# Patient Record
Sex: Male | Born: 1940 | Race: White | Hispanic: No | Marital: Married | Smoking: Never smoker
Health system: Southern US, Community
[De-identification: ages and names within clinical notes are randomized; demographics above are authoritative.]

## PROBLEM LIST (undated history)

## (undated) DIAGNOSIS — I2510 Atherosclerotic heart disease of native coronary artery without angina pectoris: Secondary | ICD-10-CM

## (undated) DIAGNOSIS — J45909 Unspecified asthma, uncomplicated: Secondary | ICD-10-CM

## (undated) DIAGNOSIS — I10 Essential (primary) hypertension: Secondary | ICD-10-CM

## (undated) DIAGNOSIS — N189 Chronic kidney disease, unspecified: Secondary | ICD-10-CM

## (undated) DIAGNOSIS — K219 Gastro-esophageal reflux disease without esophagitis: Secondary | ICD-10-CM

## (undated) HISTORY — PX: TRANSURETHRAL RESECTION OF PROSTATE: SHX73

## (undated) HISTORY — PX: CHOLECYSTECTOMY: SHX55

## (undated) HISTORY — PX: EYE SURGERY: SHX253

---

## 2005-04-06 ENCOUNTER — Ambulatory Visit: Payer: Self-pay | Admitting: Urology

## 2006-07-13 ENCOUNTER — Ambulatory Visit: Payer: Self-pay | Admitting: Gastroenterology

## 2006-09-05 ENCOUNTER — Ambulatory Visit: Payer: Self-pay | Admitting: Urology

## 2006-09-10 ENCOUNTER — Ambulatory Visit: Payer: Self-pay | Admitting: Urology

## 2006-11-14 ENCOUNTER — Ambulatory Visit: Payer: Self-pay | Admitting: Specialist

## 2007-09-04 ENCOUNTER — Ambulatory Visit: Payer: Self-pay | Admitting: Urology

## 2007-09-09 ENCOUNTER — Ambulatory Visit: Payer: Self-pay | Admitting: Urology

## 2008-09-04 ENCOUNTER — Ambulatory Visit: Payer: Self-pay | Admitting: Gastroenterology

## 2008-09-07 ENCOUNTER — Ambulatory Visit: Payer: Self-pay | Admitting: Urology

## 2008-09-22 ENCOUNTER — Ambulatory Visit: Payer: Self-pay | Admitting: Gastroenterology

## 2009-09-15 ENCOUNTER — Ambulatory Visit: Payer: Self-pay | Admitting: Urology

## 2009-10-14 ENCOUNTER — Ambulatory Visit: Payer: Self-pay | Admitting: Gastroenterology

## 2009-10-14 ENCOUNTER — Other Ambulatory Visit: Payer: Self-pay | Admitting: Radiology

## 2010-05-09 ENCOUNTER — Ambulatory Visit: Payer: Self-pay | Admitting: Gastroenterology

## 2010-09-21 ENCOUNTER — Ambulatory Visit: Payer: Self-pay | Admitting: Gastroenterology

## 2010-09-22 LAB — PATHOLOGY REPORT

## 2010-09-30 ENCOUNTER — Ambulatory Visit: Payer: Self-pay | Admitting: Urology

## 2010-11-24 ENCOUNTER — Ambulatory Visit: Payer: Self-pay | Admitting: Internal Medicine

## 2011-10-04 ENCOUNTER — Ambulatory Visit: Payer: Self-pay | Admitting: Urology

## 2012-10-02 ENCOUNTER — Ambulatory Visit: Payer: Self-pay | Admitting: Urology

## 2012-10-02 DIAGNOSIS — N411 Chronic prostatitis: Secondary | ICD-10-CM | POA: Insufficient documentation

## 2012-10-02 DIAGNOSIS — N4340 Spermatocele of epididymis, unspecified: Secondary | ICD-10-CM | POA: Insufficient documentation

## 2012-10-02 DIAGNOSIS — N138 Other obstructive and reflux uropathy: Secondary | ICD-10-CM | POA: Insufficient documentation

## 2012-10-02 DIAGNOSIS — N200 Calculus of kidney: Secondary | ICD-10-CM | POA: Insufficient documentation

## 2013-09-24 ENCOUNTER — Emergency Department: Payer: Self-pay | Admitting: Emergency Medicine

## 2013-09-24 LAB — URINALYSIS, COMPLETE
Bilirubin,UR: NEGATIVE
Glucose,UR: NEGATIVE mg/dL (ref 0–75)
KETONE: NEGATIVE
Leukocyte Esterase: NEGATIVE
Nitrite: NEGATIVE
Ph: 6 (ref 4.5–8.0)
Protein: NEGATIVE
RBC,UR: 180 /HPF (ref 0–5)
Specific Gravity: 1.011 (ref 1.003–1.030)
Squamous Epithelial: NONE SEEN
WBC UR: 1 /HPF (ref 0–5)

## 2013-09-24 LAB — COMPREHENSIVE METABOLIC PANEL
ALK PHOS: 73 U/L
AST: 30 U/L (ref 15–37)
Albumin: 4.1 g/dL (ref 3.4–5.0)
Anion Gap: 6 — ABNORMAL LOW (ref 7–16)
BUN: 17 mg/dL (ref 7–18)
Bilirubin,Total: 0.4 mg/dL (ref 0.2–1.0)
CHLORIDE: 109 mmol/L — AB (ref 98–107)
Calcium, Total: 9.1 mg/dL (ref 8.5–10.1)
Co2: 25 mmol/L (ref 21–32)
Creatinine: 1.3 mg/dL (ref 0.60–1.30)
EGFR (African American): >60
EGFR (Non-African Amer.): 54 — ABNORMAL LOW
Glucose: 112 mg/dL — ABNORMAL HIGH (ref 65–99)
Osmolality: 282 (ref 275–301)
Potassium: 3.7 mmol/L (ref 3.5–5.1)
SGPT (ALT): 24 U/L (ref 12–78)
Sodium: 140 mmol/L (ref 136–145)
TOTAL PROTEIN: 7.7 g/dL (ref 6.4–8.2)

## 2013-09-24 LAB — CBC
HCT: 42.5 % (ref 40.0–52.0)
HGB: 13.2 g/dL (ref 13.0–18.0)
MCH: 26.8 pg (ref 26.0–34.0)
MCHC: 31.1 g/dL — ABNORMAL LOW (ref 32.0–36.0)
MCV: 86 fL (ref 80–100)
Platelet: 221 10*3/uL (ref 150–440)
RBC: 4.93 10*6/uL (ref 4.40–5.90)
RDW: 13.6 % (ref 11.5–14.5)
WBC: 11.8 10*3/uL — AB (ref 3.8–10.6)

## 2013-10-09 ENCOUNTER — Ambulatory Visit: Payer: Self-pay | Admitting: Urology

## 2013-10-16 ENCOUNTER — Ambulatory Visit: Payer: Self-pay | Admitting: Urology

## 2013-10-22 ENCOUNTER — Ambulatory Visit: Payer: Self-pay | Admitting: Urology

## 2013-10-22 DIAGNOSIS — N201 Calculus of ureter: Secondary | ICD-10-CM | POA: Insufficient documentation

## 2013-10-22 DIAGNOSIS — N23 Unspecified renal colic: Secondary | ICD-10-CM | POA: Insufficient documentation

## 2013-10-23 ENCOUNTER — Ambulatory Visit: Payer: Self-pay | Admitting: Urology

## 2013-11-07 ENCOUNTER — Ambulatory Visit: Payer: Self-pay | Admitting: Urology

## 2013-11-07 DIAGNOSIS — J449 Chronic obstructive pulmonary disease, unspecified: Secondary | ICD-10-CM | POA: Insufficient documentation

## 2014-02-04 DIAGNOSIS — I10 Essential (primary) hypertension: Secondary | ICD-10-CM | POA: Insufficient documentation

## 2014-09-01 DIAGNOSIS — M1A9XX0 Chronic gout, unspecified, without tophus (tophi): Secondary | ICD-10-CM | POA: Insufficient documentation

## 2014-09-01 DIAGNOSIS — D1803 Hemangioma of intra-abdominal structures: Secondary | ICD-10-CM | POA: Insufficient documentation

## 2014-09-01 DIAGNOSIS — M1A00X0 Idiopathic chronic gout, unspecified site, without tophus (tophi): Secondary | ICD-10-CM | POA: Insufficient documentation

## 2014-09-12 NOTE — Op Note (Signed)
PATIENT NAME:  Tyler Walls, Tyler Walls MR#:  [id number] DATE OF BIRTH:  04-03-1941  DATE OF PROCEDURE:  10/23/2013  PRINCIPAL DIAGNOSIS: Left ureterolithiasis.   POSTOPERATIVE DIAGNOSIS:  Left ureterolithiasis.   PROCEDURE: Left ureteroscopy with holmium laser lithotripsy.   SURGEON: Denice Bors. Jacqlyn Larsen, M.D.   ANESTHESIA: Laryngeal mask airway anesthesia.   INDICATIONS: The patient is a 74 year old white gentleman with a history of nephrolithiasis. He presented to the Emergency Room recently with significant left-sided flank pain. He was noted to have an approximate 5 mm proximal left ureteral calculus. Subsequent follow-up imaging demonstrated a subsequent 6 mm proximal left ureteral calculus in close proximity. This had also followed into the proximal ureter. He had progression of the stone to the mid ureter overlying the pelvic brim. He underwent lithotripsy approximately one week ago.  He only had partial fragmentation of the stone. He has had continued pain and discomfort with failure of progression of the stone fragments. He presents for ureteroscopic stone removal.   DESCRIPTION OF PROCEDURE: After informed consent was obtained, the patient was taken to the Operating Room and placed in the dorsal lithotomy position under laryngeal mask airway anesthesia. The patient was then prepped and draped in the usual standard fashion. The 64 French rigid cystoscope was introduced into the urethra, under direct vision, with no urethral abnormalities noted. Upon entering the entering the prostatic fossa, moderate TUR defect was noted with minimal prostate regrowth appreciated. The bladder neck was open. Upon entering the bladder, the mucosa was inspected in its entirety with no gross mucosal lesions noted. Bilateral ureteral orifices were well visualized, with no lesions noted. A flexible tip Glidewire was introduced into the left ureteral orifice. It was easily advanced into the upper pole collecting system under  fluoroscopic guidance without difficulty. No resistance was met at the level of the stones. The cystoscope was removed. The 6 French rigid ureteroscope was advanced into the urinary bladder. It was easily advanced into the left ureteral orifice. It was advanced to the level approaching the pelvic brim. A lead stone fragment was encountered, approximately 3 to 4 mm in size. This was fragmented into additional smaller stone fragments, utilizing the holmium laser fiber. The stone was noted to be very hard, requiring additional energy for fragmentation. At least two additional stone fragments were encountered. The larger 6 mm stone was then encountered. Minimal fragmentation of this stone was appreciated. Once again, the stone was noted to be very hard, and required additional energy for adequate fragmentation. Moderate inflammation and edema of the ureter was noted at the level of the stone impaction.  Once the stone was adequately fragmented, a basket was utilized to remove the stone fragments. Some additional stone fragments were noted just proximal to the area of stone fragmentation and impaction. These were likely from the previous lithotripsy.  One of these required additional fragmentation for stone removal. Once the bulk of the stones had been removed, the scope was advanced to just above the level of the pelvic brim. Due to angulation and the lack of paralyzation, the scope could not be passed further. No additional stones were appreciated under fluoroscopy. The decision was made not to attempt any further ureteroscopy due to the degree of edema within the ureter. The ureter appeared, however, adequately open. The decision was made not to place a stent. The ureteroscope was removed. The cystoscope was replaced into the urinary bladder. The stone fragments were irrigated free. These were collected and will be sent for stone analysis. The  bladder was then drained. The cystoscope was removed. The patient was  returned to the supine position and awakened from laryngeal mask airway anesthesia. He was taken to the recovery room in stable condition. There were no problems or complications. The patient tolerated the procedure well.   SPECIMEN: Stone fragments.   ____________________________ Denice Bors. Jacqlyn Larsen, MD bsc:aj D: 10/23/2013 23:21:57 ET T: 10/24/2013 06:23:04 ET JOB#: [id number]  cc: Denice Bors. Jacqlyn Larsen, MD, <Dictator> Denice Bors Jawaan Adachi MD ELECTRONICALLY SIGNED 11/11/2013 9:25

## 2015-04-20 ENCOUNTER — Ambulatory Visit: Admit: 2015-04-20 | Payer: Self-pay | Admitting: Gastroenterology

## 2015-04-20 SURGERY — COLONOSCOPY WITH PROPOFOL
Anesthesia: General

## 2015-05-31 ENCOUNTER — Encounter: Payer: Self-pay | Admitting: *Deleted

## 2015-06-01 ENCOUNTER — Ambulatory Visit: Admission: RE | Admit: 2015-06-01 | Payer: Medicare Other | Source: Ambulatory Visit | Admitting: Gastroenterology

## 2015-06-01 ENCOUNTER — Encounter: Admission: RE | Payer: Self-pay | Source: Ambulatory Visit

## 2015-06-01 HISTORY — DX: Chronic kidney disease, unspecified: N18.9

## 2015-06-01 HISTORY — DX: Gastro-esophageal reflux disease without esophagitis: K21.9

## 2015-06-01 HISTORY — DX: Unspecified asthma, uncomplicated: J45.909

## 2015-06-01 HISTORY — DX: Essential (primary) hypertension: I10

## 2015-06-01 HISTORY — DX: Atherosclerotic heart disease of native coronary artery without angina pectoris: I25.10

## 2015-06-01 SURGERY — COLONOSCOPY WITH PROPOFOL
Anesthesia: General

## 2015-09-02 DIAGNOSIS — E782 Mixed hyperlipidemia: Secondary | ICD-10-CM | POA: Insufficient documentation

## 2015-12-14 IMAGING — CT CT STONE STUDY
2 of 4 series · 16 of 46 positions shown, 18 images · non-contrast
Comparison: DG ABDOMEN 1V dated 10/02/2012

ADDENDUM:
Not mentioned above is a 2.8 cm hypodense, fluid attenuating right
hepatic mass most consistent with a cyst. There is a second 2 cm
hypodense, fluid attenuating right hepatic mass likely representing
a cyst. There are other smaller subcentimeter hypodensities within
the liver which are too small to characterize, but likely represent
small cysts. These are unchanged compared with prior abdominal MRI
dated 05/09/2010.
CLINICAL DATA: Left flank pain radiating around the left abdomen
since [REDACTED].

EXAM:
CT ABDOMEN AND PELVIS WITHOUT CONTRAST
TECHNIQUE: Multidetector CT imaging of the abdomen and pelvis was performed
following the standard protocol without IV contrast.

[Series 2: stone standard full · axial · 0.86mm/px · z∈[+292,+757]mm · 13 of 103 slices shown, 15 images]
[im 5/103  soft-tissue]
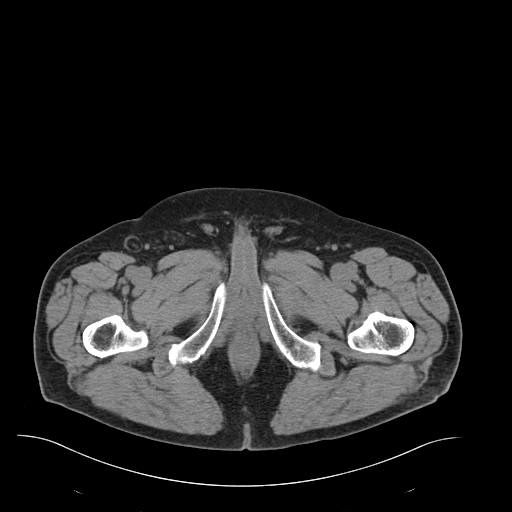
[im 5/103  bone]
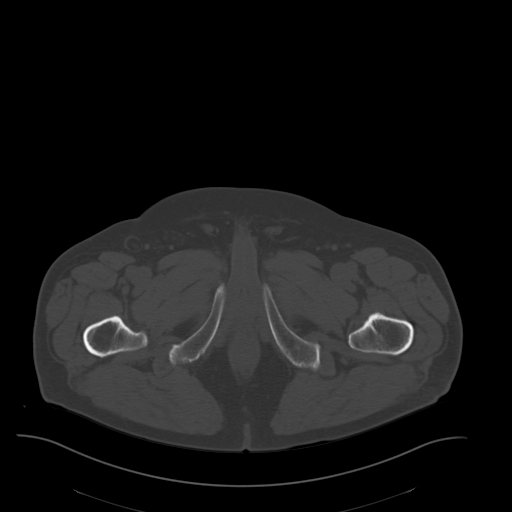
[im 13/103  soft-tissue]
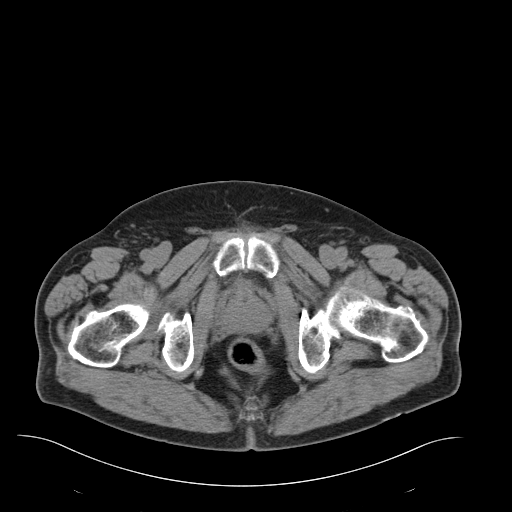
[im 22/103  soft-tissue]
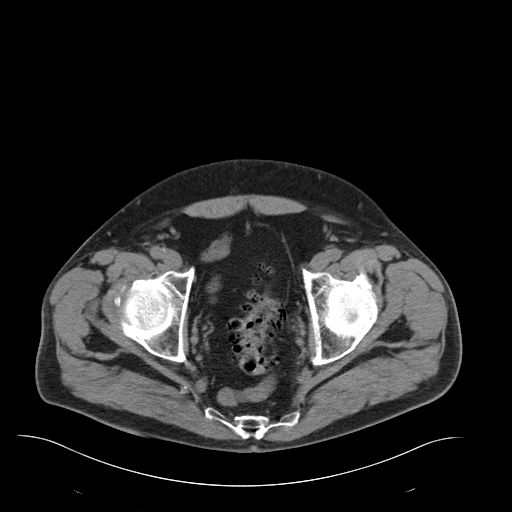
[im 30/103  soft-tissue]
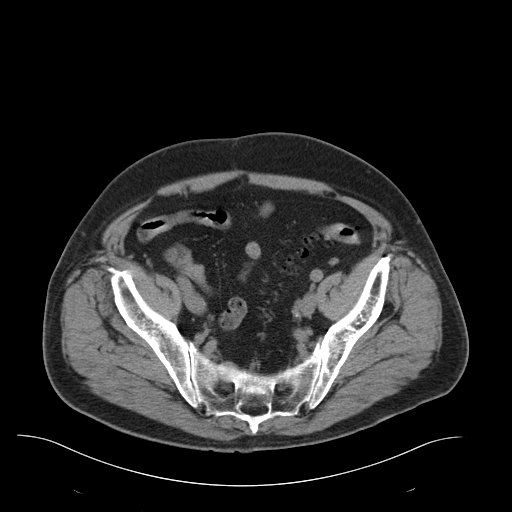
[im 35/103  soft-tissue]
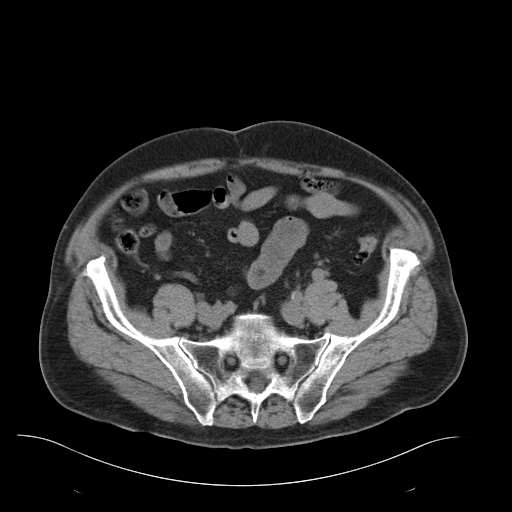
[im 43/103  soft-tissue]
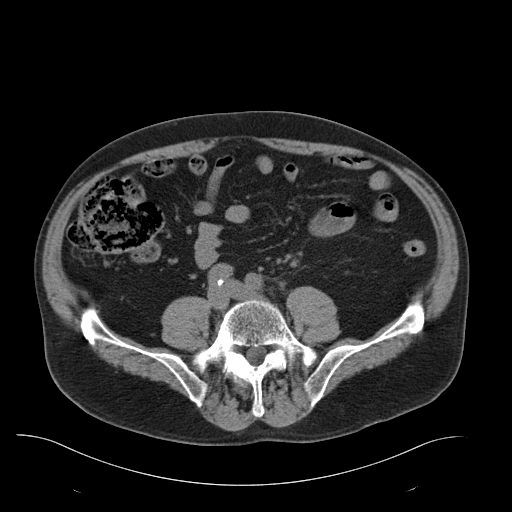
[im 52/103  soft-tissue]
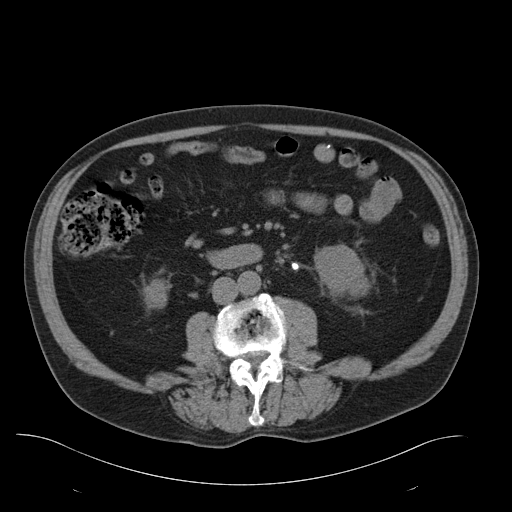
[im 60/103  soft-tissue]
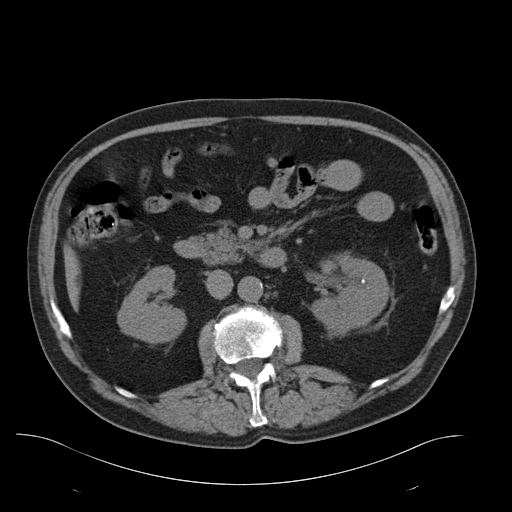
[im 69/103  soft-tissue]
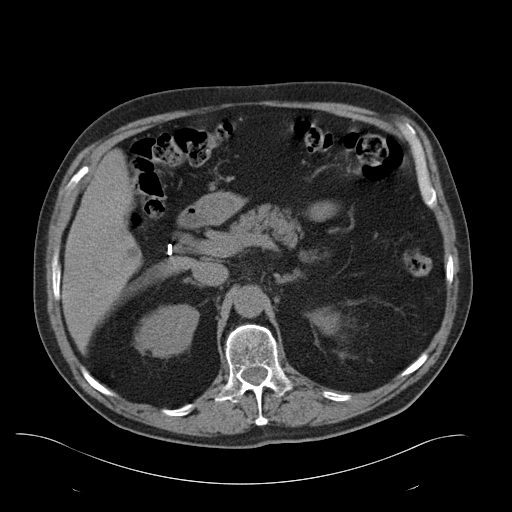
[im 69/103  bone]
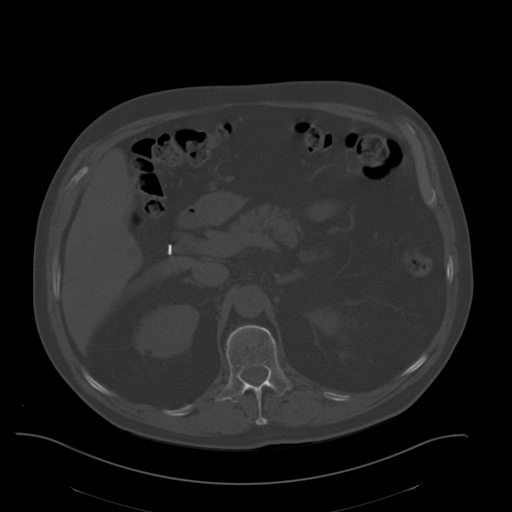
[im 73/103  soft-tissue]
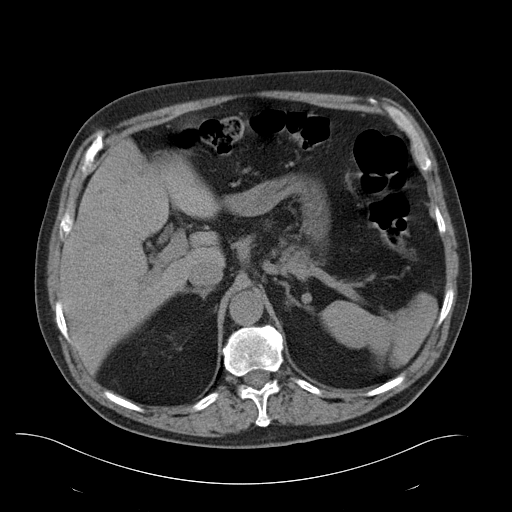
[im 81/103  soft-tissue]
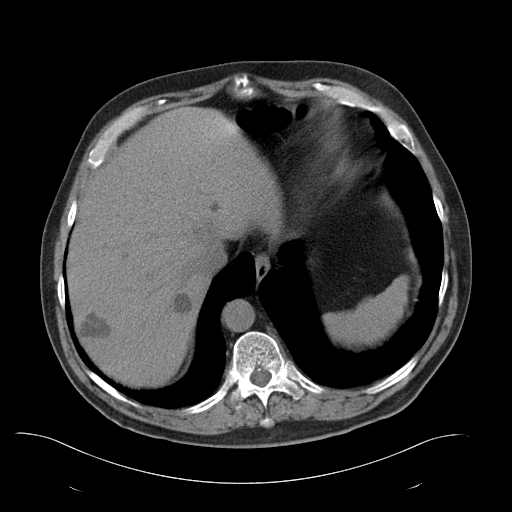
[im 90/103  soft-tissue]
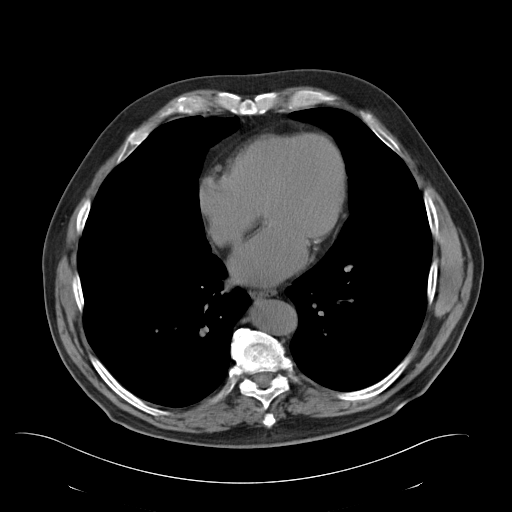
[im 98/103  soft-tissue]
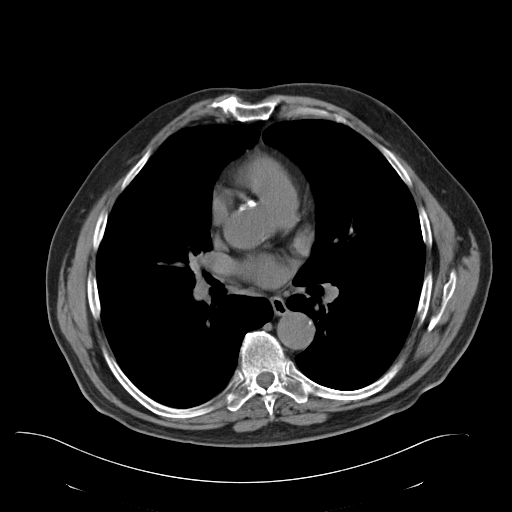

[Series 5: cor stone standard full · coronal · 0.88mm/px · 3 of 159 slices shown]
[im 53/159  soft-tissue]
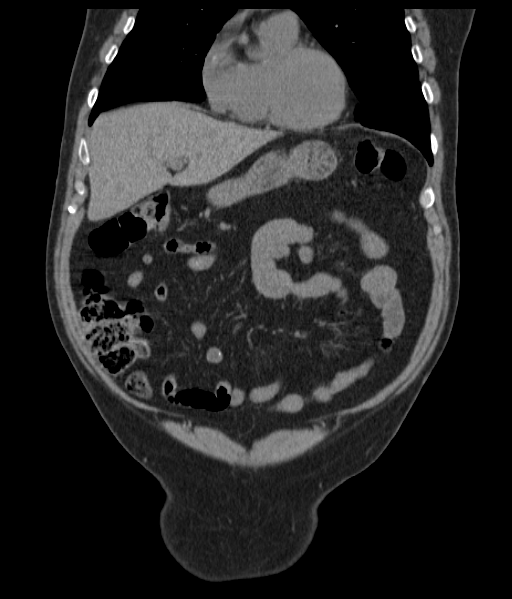
[im 71/159  soft-tissue]
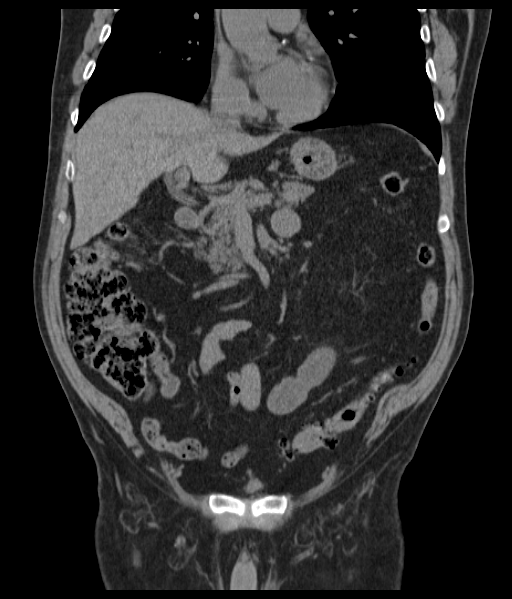
[im 88/159  soft-tissue]
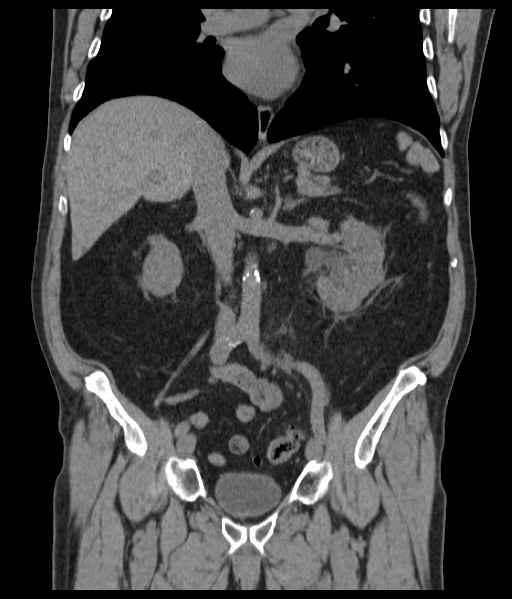

[16 of 46 positions shown; findings below may reference images not displayed]

FINDINGS: The lung bases are clear. There is coronary artery atherosclerosis
in the left main, LAD and right coronary artery.

There is a 6.2 mm proximal left ureteral calculus resulting in mild
left hydronephrosis and perinephric stranding. There are multiple
bilateral nephrolithiasis. The kidneys are symmetric in size without
evidence for exophytic mass. The bladder is unremarkable.

The liver demonstrates no focal abnormality. The gallbladder is
unremarkable. The spleen demonstrates no focal abnormality. The
adrenal glands and pancreas are normal.

The unopacified stomach, duodenum, small intestine and large
intestine are unremarkable, but evaluation is limited by lack of
oral contrast. There is a normal caliber appendix in the right lower
quadrant without periappendiceal inflammatory changes. There is
diverticulosis without evidence of diverticulitis. There is no
pneumoperitoneum, pneumatosis, or portal venous gas. There is no
abdominal or pelvic free fluid. There is no lymphadenopathy.

The abdominal aorta is normal in caliber with atherosclerosis.

There is degenerative disc disease at L1-2, L2-3, L3-4 and L4-5.
There is severe bilateral facet arthropathy at L3-4, L4-5 and L5-S1.
IMPRESSION: 1. There is a 6.2 mm proximal left ureteral calculus resulting in
mild left hydronephrosis and perinephric stranding.
2. Bilateral nephrolithiasis.
3. Lumbar spine spondylosis.

## 2016-09-05 DIAGNOSIS — E538 Deficiency of other specified B group vitamins: Secondary | ICD-10-CM | POA: Insufficient documentation

## 2017-09-06 DIAGNOSIS — G4733 Obstructive sleep apnea (adult) (pediatric): Secondary | ICD-10-CM | POA: Insufficient documentation

## 2017-09-06 DIAGNOSIS — Z9989 Dependence on other enabling machines and devices: Secondary | ICD-10-CM

## 2017-09-06 DIAGNOSIS — Z0000 Encounter for general adult medical examination without abnormal findings: Secondary | ICD-10-CM | POA: Insufficient documentation

## 2017-10-04 DIAGNOSIS — I4729 Other ventricular tachycardia: Secondary | ICD-10-CM | POA: Insufficient documentation

## 2017-10-04 DIAGNOSIS — I472 Ventricular tachycardia: Secondary | ICD-10-CM | POA: Insufficient documentation

## 2018-04-20 ENCOUNTER — Emergency Department: Payer: No Typology Code available for payment source

## 2018-04-20 ENCOUNTER — Encounter: Payer: Self-pay | Admitting: Emergency Medicine

## 2018-04-20 ENCOUNTER — Other Ambulatory Visit: Payer: Self-pay

## 2018-04-20 ENCOUNTER — Emergency Department
Admission: EM | Admit: 2018-04-20 | Discharge: 2018-04-20 | Disposition: A | Discharge status: Home / Self Care | Payer: No Typology Code available for payment source | Attending: Emergency Medicine | Admitting: Emergency Medicine

## 2018-04-20 DIAGNOSIS — I2510 Atherosclerotic heart disease of native coronary artery without angina pectoris: Secondary | ICD-10-CM | POA: Insufficient documentation

## 2018-04-20 DIAGNOSIS — Y9389 Activity, other specified: Secondary | ICD-10-CM | POA: Insufficient documentation

## 2018-04-20 DIAGNOSIS — J45909 Unspecified asthma, uncomplicated: Secondary | ICD-10-CM | POA: Insufficient documentation

## 2018-04-20 DIAGNOSIS — S2231XA Fracture of one rib, right side, initial encounter for closed fracture: Secondary | ICD-10-CM | POA: Diagnosis not present

## 2018-04-20 DIAGNOSIS — N189 Chronic kidney disease, unspecified: Secondary | ICD-10-CM | POA: Insufficient documentation

## 2018-04-20 DIAGNOSIS — K869 Disease of pancreas, unspecified: Secondary | ICD-10-CM

## 2018-04-20 DIAGNOSIS — I129 Hypertensive chronic kidney disease with stage 1 through stage 4 chronic kidney disease, or unspecified chronic kidney disease: Secondary | ICD-10-CM | POA: Insufficient documentation

## 2018-04-20 DIAGNOSIS — Z79899 Other long term (current) drug therapy: Secondary | ICD-10-CM | POA: Insufficient documentation

## 2018-04-20 DIAGNOSIS — S299XXA Unspecified injury of thorax, initial encounter: Secondary | ICD-10-CM | POA: Diagnosis present

## 2018-04-20 DIAGNOSIS — Y999 Unspecified external cause status: Secondary | ICD-10-CM | POA: Insufficient documentation

## 2018-04-20 DIAGNOSIS — D490 Neoplasm of unspecified behavior of digestive system: Secondary | ICD-10-CM | POA: Diagnosis not present

## 2018-04-20 DIAGNOSIS — Y92410 Unspecified street and highway as the place of occurrence of the external cause: Secondary | ICD-10-CM | POA: Diagnosis not present

## 2018-04-20 LAB — COMPREHENSIVE METABOLIC PANEL
ALT: 21 U/L (ref 0–44)
AST: 27 U/L (ref 15–41)
Albumin: 4.6 g/dL (ref 3.5–5.0)
Alkaline Phosphatase: 66 U/L (ref 38–126)
Anion gap: 10 (ref 5–15)
BUN: 15 mg/dL (ref 8–23)
CO2: 28 mmol/L (ref 22–32)
CREATININE: 0.87 mg/dL (ref 0.61–1.24)
Calcium: 9.4 mg/dL (ref 8.9–10.3)
Chloride: 103 mmol/L (ref 98–111)
Glucose, Bld: 121 mg/dL — ABNORMAL HIGH (ref 70–99)
POTASSIUM: 3.6 mmol/L (ref 3.5–5.1)
Sodium: 141 mmol/L (ref 135–145)
Total Bilirubin: 0.8 mg/dL (ref 0.3–1.2)
Total Protein: 7.7 g/dL (ref 6.5–8.1)

## 2018-04-20 LAB — CBC WITH DIFFERENTIAL/PLATELET
Abs Immature Granulocytes: 0.04 10*3/uL (ref 0.00–0.07)
Basophils Absolute: 0 10*3/uL (ref 0.0–0.1)
Basophils Relative: 0 %
EOS ABS: 0.4 10*3/uL (ref 0.0–0.5)
EOS PCT: 4 %
HEMATOCRIT: 43.9 % (ref 39.0–52.0)
Hemoglobin: 13.8 g/dL (ref 13.0–17.0)
Immature Granulocytes: 0 %
Lymphocytes Relative: 14 %
Lymphs Abs: 1.3 10*3/uL (ref 0.7–4.0)
MCH: 28 pg (ref 26.0–34.0)
MCHC: 31.4 g/dL (ref 30.0–36.0)
MCV: 89.2 fL (ref 80.0–100.0)
MONOS PCT: 7 %
Monocytes Absolute: 0.6 10*3/uL (ref 0.1–1.0)
NRBC: 0 % (ref 0.0–0.2)
Neutro Abs: 6.7 10*3/uL (ref 1.7–7.7)
Neutrophils Relative %: 75 %
Platelets: 257 10*3/uL (ref 150–400)
RBC: 4.92 MIL/uL (ref 4.22–5.81)
RDW: 13.7 % (ref 11.5–15.5)
WBC: 9 10*3/uL (ref 4.0–10.5)

## 2018-04-20 MED ORDER — HYDROCODONE-ACETAMINOPHEN 5-325 MG PO TABS: 1.0000 | ORAL_TABLET | ORAL | 0 refills | Status: DC | PRN

## 2018-04-20 MED ORDER — IOHEXOL 350 MG/ML SOLN
75.0000 mL | Freq: Once | INTRAVENOUS | Status: AC | PRN
  Administered 2018-04-20: 75 mL via INTRAVENOUS
  Filled 2018-04-20: qty 75

## 2018-04-20 MED ORDER — SODIUM CHLORIDE 0.9 % IV BOLUS
1000.0000 mL | Freq: Once | INTRAVENOUS | Status: AC
  Administered 2018-04-20: 1000 mL via INTRAVENOUS
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
## 2018-05-01 ENCOUNTER — Encounter: Payer: Self-pay | Admitting: Urology

## 2018-05-01 ENCOUNTER — Other Ambulatory Visit: Payer: Self-pay

## 2018-05-01 ENCOUNTER — Ambulatory Visit (INDEPENDENT_AMBULATORY_CARE_PROVIDER_SITE_OTHER): Payer: Medicare Other | Admitting: Urology

## 2018-05-01 VITALS — BP 154/90 | HR 60 | Wt 196.0 lb

## 2018-05-01 DIAGNOSIS — N411 Chronic prostatitis: Secondary | ICD-10-CM | POA: Diagnosis not present

## 2018-05-01 DIAGNOSIS — N138 Other obstructive and reflux uropathy: Secondary | ICD-10-CM | POA: Diagnosis not present

## 2018-05-01 DIAGNOSIS — N401 Enlarged prostate with lower urinary tract symptoms: Secondary | ICD-10-CM | POA: Diagnosis not present

## 2018-05-01 DIAGNOSIS — Z87442 Personal history of urinary calculi: Secondary | ICD-10-CM | POA: Diagnosis not present

## 2018-05-01 LAB — URINALYSIS, COMPLETE
Bilirubin, UA: NEGATIVE
Glucose, UA: NEGATIVE
Ketones, UA: NEGATIVE
Leukocytes, UA: NEGATIVE
NITRITE UA: NEGATIVE
PROTEIN UA: NEGATIVE
RBC UA: NEGATIVE
Specific Gravity, UA: 1.02 (ref 1.005–1.030)
UUROB: 1 mg/dL (ref 0.2–1.0)
pH, UA: 7 (ref 5.0–7.5)

## 2018-05-01 MED ORDER — SILDENAFIL CITRATE 20 MG PO TABS: ORAL_TABLET | ORAL | 0 refills | Status: DC
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
## 2018-06-26 ENCOUNTER — Encounter
Admission: RE | Disposition: A | Discharge status: Home / Self Care | Payer: Self-pay | Source: Home / Self Care | Attending: Internal Medicine

## 2018-06-26 ENCOUNTER — Ambulatory Visit: Payer: Medicare Other | Admitting: Certified Registered"

## 2018-06-26 ENCOUNTER — Ambulatory Visit
Admission: RE | Admit: 2018-06-26 | Discharge: 2018-06-26 | Disposition: A | Discharge status: Home / Self Care | Payer: Medicare Other | Attending: Internal Medicine | Admitting: Internal Medicine

## 2018-06-26 ENCOUNTER — Encounter: Payer: Self-pay | Admitting: Internal Medicine

## 2018-06-26 DIAGNOSIS — Z79899 Other long term (current) drug therapy: Secondary | ICD-10-CM | POA: Insufficient documentation

## 2018-06-26 DIAGNOSIS — G4730 Sleep apnea, unspecified: Secondary | ICD-10-CM | POA: Diagnosis not present

## 2018-06-26 DIAGNOSIS — I2510 Atherosclerotic heart disease of native coronary artery without angina pectoris: Secondary | ICD-10-CM | POA: Diagnosis not present

## 2018-06-26 DIAGNOSIS — K640 First degree hemorrhoids: Secondary | ICD-10-CM | POA: Diagnosis not present

## 2018-06-26 DIAGNOSIS — I129 Hypertensive chronic kidney disease with stage 1 through stage 4 chronic kidney disease, or unspecified chronic kidney disease: Secondary | ICD-10-CM | POA: Insufficient documentation

## 2018-06-26 DIAGNOSIS — Z7982 Long term (current) use of aspirin: Secondary | ICD-10-CM | POA: Diagnosis not present

## 2018-06-26 DIAGNOSIS — D124 Benign neoplasm of descending colon: Secondary | ICD-10-CM | POA: Insufficient documentation

## 2018-06-26 DIAGNOSIS — Z09 Encounter for follow-up examination after completed treatment for conditions other than malignant neoplasm: Secondary | ICD-10-CM | POA: Diagnosis present

## 2018-06-26 DIAGNOSIS — N189 Chronic kidney disease, unspecified: Secondary | ICD-10-CM | POA: Diagnosis not present

## 2018-06-26 DIAGNOSIS — K219 Gastro-esophageal reflux disease without esophagitis: Secondary | ICD-10-CM | POA: Insufficient documentation

## 2018-06-26 DIAGNOSIS — Z86010 Personal history of colonic polyps: Secondary | ICD-10-CM | POA: Diagnosis not present

## 2018-06-26 DIAGNOSIS — K5730 Diverticulosis of large intestine without perforation or abscess without bleeding: Secondary | ICD-10-CM | POA: Diagnosis not present

## 2018-06-26 DIAGNOSIS — Z7951 Long term (current) use of inhaled steroids: Secondary | ICD-10-CM | POA: Diagnosis not present

## 2018-06-26 HISTORY — PX: COLONOSCOPY WITH PROPOFOL: SHX5780

## 2018-06-26 SURGERY — COLONOSCOPY WITH PROPOFOL
Anesthesia: General

## 2018-06-26 MED ORDER — PROPOFOL 10 MG/ML IV BOLUS
INTRAVENOUS | Status: DC | PRN
  Administered 2018-06-26: 30 mg via INTRAVENOUS
  Administered 2018-06-26: 10 mg via INTRAVENOUS
  Administered 2018-06-26 (×2): 50 mg via INTRAVENOUS
  Administered 2018-06-26: 30 mg via INTRAVENOUS

## 2018-06-26 MED ORDER — SODIUM CHLORIDE 0.9 % IV SOLN
INTRAVENOUS | Status: DC
  Administered 2018-06-26: 1000 mL via INTRAVENOUS

## 2018-06-26 MED ORDER — PROPOFOL 10 MG/ML IV BOLUS
INTRAVENOUS | Status: AC
  Filled 2018-06-26: qty 20

## 2018-06-26 MED ORDER — PROPOFOL 500 MG/50ML IV EMUL
INTRAVENOUS | Status: AC
  Filled 2018-06-26: qty 50

## 2018-06-26 NOTE — Anesthesia Postprocedure Evaluation (Signed)
Anesthesia Post Note  Patient: Tyler Walls  Procedure(s) Performed: COLONOSCOPY WITH PROPOFOL (N/A )  Patient location during evaluation: Endoscopy Anesthesia Type: General Level of consciousness: awake and alert and oriented Pain management: pain level controlled Vital Signs Assessment: post-procedure vital signs reviewed and stable Respiratory status: spontaneous breathing, nonlabored ventilation and respiratory function stable Cardiovascular status: blood pressure returned to baseline and stable Postop Assessment: no signs of nausea or vomiting Anesthetic complications: no     Last Vitals:  Vitals:   06/26/18 1333 06/26/18 1343  BP: 127/75 (!) 142/82  Pulse: (!) 58 (!) 51  Resp: 20 14  Temp:    SpO2: 99% 100%    Last Pain:  Vitals:   06/26/18 1343  TempSrc:   PainSc: 0-No pain                 Jahnia Hewes

## 2018-06-26 NOTE — Transfer of Care (Signed)
Immediate Anesthesia Transfer of Care Note  Patient: Tyler Walls  Procedure(s) Performed: COLONOSCOPY WITH PROPOFOL (N/A )  Patient Location: PACU and Endoscopy Unit  Anesthesia Type:General  Level of Consciousness: awake, drowsy and patient cooperative  Airway & Oxygen Therapy: Patient Spontanous Breathing and Patient connected to face mask oxygen  Post-op Assessment: Report given to RN and Post -op Vital signs reviewed and stable  Post vital signs: Reviewed and stable  Last Vitals:  Vitals Value Taken Time  BP 117/66 06/26/2018  1:23 PM  Temp 36.1 C 06/26/2018  1:23 PM  Pulse 57 06/26/2018  1:23 PM  Resp 14 06/26/2018  1:23 PM  SpO2 97 % 06/26/2018  1:23 PM    Last Pain:  Vitals:   06/26/18 1323  TempSrc: Tympanic  PainSc: Asleep         Complications: No apparent anesthesia complications

## 2018-06-26 NOTE — Op Note (Signed)
[facility] Gastroenterology Patient Name: Tyler Walls Procedure Date: 06/26/2018 11:54 AM MRN: [id number] Account #: [id number] Date of Birth: 04/26/1941 Admit Type: Outpatient Age: 78 Room: [facility] ENDO ROOM 2 Gender: Male Note Status: Finalized Procedure:            Colonoscopy Indications:          High risk colon cancer surveillance: Personal history                        of colonic polyps Providers:            Benay Pike. [name] MD, MD Referring MD:         Rusty Aus, MD (Referring MD) Medicines:            Propofol per Anesthesia Complications:        No immediate complications. Procedure:            Pre-Anesthesia Assessment:                       - The risks and benefits of the procedure and the                        sedation options and risks were discussed with the                        patient. All questions were answered and informed                        consent was obtained.                       - Patient identification and proposed procedure were                        verified prior to the procedure by the nurse. The                        procedure was verified in the procedure room.                       - ASA Grade Assessment: III - A patient with severe                        systemic disease.                       - After reviewing the risks and benefits, the patient                        was deemed in satisfactory condition to undergo the                        procedure.                       After obtaining informed consent, the colonoscope was                        passed under direct vision. Throughout the procedure,  the patient's blood pressure, pulse, and oxygen                        saturations were monitored continuously. The                        Colonoscope was introduced through the anus and                        advanced to the the cecum, identified by appendiceal                        orifice  and ileocecal valve. The colonoscopy was                        performed without difficulty. The patient tolerated the                        procedure well. The quality of the bowel preparation                        was good. The ileocecal valve, appendiceal orifice, and                        rectum were photographed. Findings:      The perianal and digital rectal examinations were normal. Pertinent       negatives include normal sphincter tone and no palpable rectal lesions.      Many small and large-mouthed diverticula were found in the sigmoid colon.      A 6 mm polyp was found in the descending colon. The polyp was       semi-pedunculated. The polyp was removed with a cold snare. Resection       and retrieval were complete.      Non-bleeding internal hemorrhoids were found during retroflexion. The       hemorrhoids were Grade I (internal hemorrhoids that do not prolapse).      The exam was otherwise without abnormality. Impression:           - Diverticulosis in the sigmoid colon.                       - One 6 mm polyp in the descending colon, removed with                        a cold snare. Resected and retrieved.                       - Non-bleeding internal hemorrhoids.                       - The examination was otherwise normal. Recommendation:       - Patient has a contact number available for                        emergencies. The signs and symptoms of potential                        delayed complications were discussed with the patient.  Return to normal activities tomorrow. Written discharge                        instructions were provided to the patient.                       - Resume previous diet.                       - Continue present medications.                       - No repeat colonoscopy due to current age (26 years or                        older).                       - Return to GI office PRN.                       - The findings  and recommendations were discussed with                        the patient and their family. Procedure Code(s):    --- Professional ---                       [phone], Colonoscopy, flexible; with removal of tumor(s),                        polyp(s), or other lesion(s) by snare technique Diagnosis Code(s):    --- Professional ---                       K57.30, Diverticulosis of large intestine without                        perforation or abscess without bleeding                       D12.4, Benign neoplasm of descending colon                       K64.0, First degree hemorrhoids                       Z86.010, Personal history of colonic polyps CPT copyright 2018 American Medical Association. All rights reserved. The codes documented in this report are preliminary and upon coder review may  be revised to meet current compliance requirements. Efrain Sella MD, MD 06/26/2018 1:22:11 PM This report has been signed electronically. Number of Addenda: 0 Note Initiated On: 06/26/2018 11:54 AM Scope Withdrawal Time: 0 hours 6 minutes 4 seconds  Total Procedure Duration: 0 hours 8 minutes 55 seconds       [facility]

## 2018-06-26 NOTE — Anesthesia Post-op Follow-up Note (Signed)
Anesthesia QCDR form completed.        

## 2018-06-26 NOTE — Anesthesia Preprocedure Evaluation (Signed)
Anesthesia Evaluation  Patient identified by MRN, date of birth, ID band Patient awake    Reviewed: Allergy & Precautions, NPO status , Patient's Chart, lab work & pertinent test results  History of Anesthesia Complications Negative for: history of anesthetic complications  Airway Mallampati: II  TM Distance: >3 FB Neck ROM: Full    Dental  (+) Partial Lower, Partial Upper   Pulmonary sleep apnea and Continuous Positive Airway Pressure Ventilation , COPD,    breath sounds clear to auscultation- rhonchi (-) wheezing      Cardiovascular hypertension, + CAD  (-) Past MI, (-) Cardiac Stents and (-) CABG  Rhythm:Regular Rate:Normal - Systolic murmurs and - Diastolic murmurs Stress Echo 01/04/18: Normal Stress Echocardiogram NORMAL RIGHT VENTRICULAR SYSTOLIC FUNCTION MILD VALVULAR REGURGITATION (Mild MR, mild AI) NO VALVULAR STENOSIS NOTED   Neuro/Psych neg Seizures negative neurological ROS  negative psych ROS   GI/Hepatic GERD  ,  Endo/Other  negative endocrine ROSneg diabetes  Renal/GU Renal disease: hx of nephrolithiasis.     Musculoskeletal  (+) Arthritis ,   Abdominal (+) - obese,   Peds  Hematology   Anesthesia Other Findings Past Medical History: No date: Asthma No date: Chronic kidney disease No date: Coronary artery disease No date: GERD (gastroesophageal reflux disease) No date: Hypertension   Reproductive/Obstetrics                             Anesthesia Physical Anesthesia Plan  ASA: III  Anesthesia Plan: General   Post-op Pain Management:    Induction: Intravenous  PONV Risk Score and Plan: 1 and Propofol infusion  Airway Management Planned: Natural Airway  Additional Equipment:   Intra-op Plan:   Post-operative Plan:   Informed Consent: I have reviewed the patients History and Physical, chart, labs and discussed the procedure including the risks, benefits and  alternatives for the proposed anesthesia with the patient or authorized representative who has indicated his/her understanding and acceptance.     Dental advisory given  Plan Discussed with: CRNA and Anesthesiologist  Anesthesia Plan Comments:         Anesthesia Quick Evaluation

## 2018-06-26 NOTE — Interval H&P Note (Signed)
History and Physical Interval Note:  06/26/2018 12:54 PM  Tyler Walls  has presented today for surgery, with the diagnosis of PERSONAL HX.OF COLON PLYPS  The various methods of treatment have been discussed with the patient and family. After consideration of risks, benefits and other options for treatment, the patient has consented to  Procedure(s): COLONOSCOPY WITH PROPOFOL (N/A) as a surgical intervention .  The patient's history has been reviewed, patient examined, no change in status, stable for surgery.  I have reviewed the patient's chart and labs.  Questions were answered to the patient's satisfaction.     [place], [place]

## 2018-06-26 NOTE — H&P (Signed)
  Outpatient short stay form Pre-procedure 06/26/2018 12:52 PM Tyler Walls K. [name], M.D.  Primary Physician: Emily Filbert, M.D.  Reason for visit:  Personal hx of colon polyps.  History of present illness:                            Patient presents for colonoscopy for a personal hx of colon polyps. The patient denies abdominal pain, abnormal weight loss or rectal bleeding.     Current Facility-Administered Medications:  .  0.9 %  sodium chloride infusion, , Intravenous, Continuous, [place], Benay Pike, MD, Last Rate: 20 mL/hr at 06/26/18 1222  Medications Prior to Admission  Medication Sig Dispense Refill Last Dose  . aspirin EC 81 MG tablet Take 81 mg by mouth daily.   Past Week at Unknown time  . allopurinol (ZYLOPRIM) 100 MG tablet Take 200 mg by mouth daily.   Taking  . amLODipine (NORVASC) 5 MG tablet Take 5 mg by mouth daily.   06/26/2018 at 0800  . clonazePAM (KLONOPIN) 1 MG tablet Take 1 mg by mouth at bedtime.   Taking  . esomeprazole (NEXIUM) 20 MG capsule Take 20 mg by mouth 2 (two) times daily before a meal.   Taking  . fluticasone (FLONASE) 50 MCG/ACT nasal spray Place 2 sprays into both nostrils daily.   Taking  . HYDROcodone-acetaminophen (NORCO/VICODIN) 5-325 MG tablet Take 1 tablet by mouth every 4 (four) hours as needed for moderate pain. 20 tablet 0 Taking  . metoprolol (LOPRESSOR) 50 MG tablet Take 50 mg by mouth 2 (two) times daily.   06/26/2018 at 0800  . sildenafil (REVATIO) 20 MG tablet 2-5 tabs 1 hour prior to intercourse 10 tablet 0   . simvastatin (ZOCOR) 40 MG tablet Take 40 mg by mouth daily at 6 PM.   Taking  . temazepam (RESTORIL) 30 MG capsule Take 30 mg by mouth at bedtime as needed for sleep.   Taking     Allergies  Allergen Reactions  . Ace Inhibitors      Past Medical History:  Diagnosis Date  . Asthma   . Chronic kidney disease   . Coronary artery disease   . GERD (gastroesophageal reflux disease)   . Hypertension     Review of systems:   Otherwise negative.    Physical Exam  Gen: Alert, oriented. Appears stated age.  HEENT: [place]/AT. PERRLA. Lungs: CTA, no wheezes. CV: RR nl S1, S2. Abd: soft, benign, no masses. BS+ Ext: No edema. Pulses 2+    Planned procedures: Proceed with colonoscopy. The patient understands the nature of the planned procedure, indications, risks, alternatives and potential complications including but not limited to bleeding, infection, perforation, damage to internal organs and possible oversedation/side effects from anesthesia. The patient agrees and gives consent to proceed.  Please refer to procedure notes for findings, recommendations and patient disposition/instructions.     Melady Chow K. [name], M.D. Gastroenterology 06/26/2018  12:52 PM

## 2018-06-27 LAB — SURGICAL PATHOLOGY

## 2019-04-30 ENCOUNTER — Other Ambulatory Visit: Payer: Self-pay | Admitting: Urology

## 2019-04-30 DIAGNOSIS — N200 Calculus of kidney: Secondary | ICD-10-CM

## 2019-05-01 ENCOUNTER — Ambulatory Visit: Payer: Medicare Other | Admitting: Urology

## 2019-05-01 ENCOUNTER — Encounter: Payer: Self-pay | Admitting: Urology

## 2019-06-18 ENCOUNTER — Ambulatory Visit: Payer: Medicare Other | Admitting: Urology

## 2019-07-14 ENCOUNTER — Ambulatory Visit
Admission: RE | Admit: 2019-07-14 | Discharge: 2019-07-14 | Disposition: A | Discharge status: Home / Self Care | Payer: Medicare Other | Source: Ambulatory Visit | Attending: Urology | Admitting: Urology

## 2019-07-14 ENCOUNTER — Encounter: Payer: Self-pay | Admitting: Urology

## 2019-07-14 ENCOUNTER — Ambulatory Visit (INDEPENDENT_AMBULATORY_CARE_PROVIDER_SITE_OTHER): Payer: Medicare Other | Admitting: Urology

## 2019-07-14 ENCOUNTER — Other Ambulatory Visit: Payer: Self-pay

## 2019-07-14 VITALS — BP 151/100 | HR 61 | Ht 70.0 in | Wt 196.0 lb

## 2019-07-14 DIAGNOSIS — R351 Nocturia: Secondary | ICD-10-CM | POA: Diagnosis not present

## 2019-07-14 DIAGNOSIS — N200 Calculus of kidney: Secondary | ICD-10-CM

## 2019-07-14 MED ORDER — TAMSULOSIN HCL 0.4 MG PO CAPS: 0.4000 mg | ORAL_CAPSULE | Freq: Every day | ORAL | 0 refills | Status: DC

## 2019-07-14 NOTE — Progress Notes (Signed)
07/14/2019 2:42 PM   Tyler Walls Feb 03, 1941 [id number]  Referring provider: Rusty Aus, MD [facility] [place],  [place] [address]  Chief Complaint  Patient presents with  . Nephrolithiasis    HPI: 79 y.o. male presents for follow-up of nephrolithiasis. CT performed November 2019 showed bilateral, nonobstructing renal calculi the largest in the lower pole of the right kidney measuring 7 mm and lower pole of the left kidney measuring 5 mm.  There was a right upper pole simple cyst present.  Since his last visit he passed a BB sized calculus a few months ago with minimal discomfort.  He is presently asymptomatic and denies flank, abdominal pelvic pain.  No gross hematuria.  He does complain of nocturia x3 and does have sleep apnea using a CPAP.  He has mild urinary frequency and urgency.  PMH: Past Medical History:  Diagnosis Date  . Asthma   . Chronic kidney disease   . Coronary artery disease   . GERD (gastroesophageal reflux disease)   . Hypertension     Surgical History: Past Surgical History:  Procedure Laterality Date  . CHOLECYSTECTOMY    . COLONOSCOPY WITH PROPOFOL N/A 06/26/2018   Procedure: COLONOSCOPY WITH PROPOFOL;  Surgeon: Toledo, Benay Pike, MD;  Location: [facility] ENDOSCOPY;  Service: Gastroenterology;  Laterality: N/A;  . EYE SURGERY    . TRANSURETHRAL RESECTION OF PROSTATE      Home Medications:  Allergies as of 07/14/2019      Reactions   Ace Inhibitors       Medication List       Accurate as of July 14, 2019  2:42 PM. If you have any questions, ask your nurse or doctor.        allopurinol 100 MG tablet Commonly known as: ZYLOPRIM Take 200 mg by mouth daily.   amLODipine 5 MG tablet Commonly known as: NORVASC Take 5 mg by mouth daily.   aspirin EC 81 MG tablet Take 81 mg by mouth daily.   clonazePAM 1 MG tablet Commonly known as: KLONOPIN Take 1 mg by mouth at bedtime.   esomeprazole 20  MG capsule Commonly known as: NEXIUM Take 20 mg by mouth 2 (two) times daily before a meal.   fluticasone 50 MCG/ACT nasal spray Commonly known as: FLONASE Place 2 sprays into both nostrils daily.   HYDROcodone-acetaminophen 5-325 MG tablet Commonly known as: NORCO/VICODIN Take 1 tablet by mouth every 4 (four) hours as needed for moderate pain.   metoprolol tartrate 50 MG tablet Commonly known as: LOPRESSOR Take 50 mg by mouth 2 (two) times daily.   sildenafil 20 MG tablet Commonly known as: REVATIO 2-5 tabs 1 hour prior to intercourse   simvastatin 40 MG tablet Commonly known as: ZOCOR Take 40 mg by mouth daily at 6 PM.   temazepam 30 MG capsule Commonly known as: RESTORIL Take 30 mg by mouth at bedtime as needed for sleep.       Allergies:  Allergies  Allergen Reactions  . Ace Inhibitors     Family History: No family history on file.  Social History:  reports that he has never smoked. He has never used smokeless tobacco. He reports current alcohol use. He reports that he does not use drugs.   Physical Exam: BP (!) 151/100 (BP Location: Left Arm, Patient Position: Sitting, Cuff Size: Large)   Pulse 61   Ht 5\' 10"  (1.778 m)   Wt 196 lb (88.9 kg)  BMI 28.12 kg/m   Constitutional:  Alert and oriented, No acute distress. HEENT: [place] AT, moist mucus membranes.  Trachea midline, no masses. Cardiovascular: No clubbing, cyanosis, or edema. Respiratory: Normal respiratory effort, no increased work of breathing. Skin: No rashes, bruises or suspicious lesions. Neurologic: Grossly intact, no focal deficits, moving all 4 extremities. Psychiatric: Normal mood and affect.   Assessment & Plan:    - Nephrolithiasis KUB performed today was reviewed.  There is a dense 10 mm calcification overlying the inferior portion of the right renal outline and 5 mm left lower pole calcification.  Small, bilateral renal calculi noted.  No calcifications noted along the expected course  of the ureter.  - Nocturia He was interested in a trial of medical management.  Rx tamsulosin was sent to pharmacy.  Abbie Sons, [facility] [address] [place], [place] [address] [phone]

## 2019-07-18 ENCOUNTER — Encounter: Payer: Self-pay | Admitting: Urology

## 2019-07-18 DIAGNOSIS — R351 Nocturia: Secondary | ICD-10-CM | POA: Insufficient documentation

## 2020-02-04 ENCOUNTER — Ambulatory Visit (INDEPENDENT_AMBULATORY_CARE_PROVIDER_SITE_OTHER): Payer: Medicare Other | Admitting: Surgery

## 2020-02-04 ENCOUNTER — Encounter: Payer: Self-pay | Admitting: Surgery

## 2020-02-04 ENCOUNTER — Other Ambulatory Visit: Payer: Self-pay

## 2020-02-04 VITALS — BP 146/93 | HR 70 | Temp 98.0°F | Ht 71.0 in | Wt 208.8 lb

## 2020-02-04 DIAGNOSIS — D1722 Benign lipomatous neoplasm of skin and subcutaneous tissue of left arm: Secondary | ICD-10-CM

## 2020-02-04 NOTE — Progress Notes (Signed)
02/04/2020  Reason for Visit:  Left shoulder lipoma  Referring Provider:  Emily Filbert, MD  History of Present Illness: Tyler Walls is a 79 y.o. male presenting for evaluation of a left shoulder mass.  The patient reports that he's had it for many years, probably more than 5 but less than 10.  He says that mass has been slowly growing in size.  Otherwise, he denies any symptoms from it.  He denies any left shoulder pain, issues with range of motion, or sensory/motor deficits.  Denies any pain at the mass itself, redness of the skin, or drainage.    Past Medical History: Past Medical History:  Diagnosis Date  . Asthma   . Chronic kidney disease   . Coronary artery disease   . GERD (gastroesophageal reflux disease)   . Hypertension      Past Surgical History: Past Surgical History:  Procedure Laterality Date  . CHOLECYSTECTOMY    . COLONOSCOPY WITH PROPOFOL N/A 06/26/2018   Procedure: COLONOSCOPY WITH PROPOFOL;  Surgeon: Toledo, Benay Pike, MD;  Location: [facility] ENDOSCOPY;  Service: Gastroenterology;  Laterality: N/A;  . EYE SURGERY    . TRANSURETHRAL RESECTION OF PROSTATE      Home Medications: Prior to Admission medications   Medication Sig Start Date End Date Taking? Authorizing Provider  allopurinol (ZYLOPRIM) 100 MG tablet Take 200 mg by mouth daily.   Yes [provider]  amLODipine (NORVASC) 5 MG tablet Take 5 mg by mouth daily.   Yes [provider]  aspirin EC 81 MG tablet Take 81 mg by mouth daily.   Yes [provider]  clonazePAM (KLONOPIN) 1 MG tablet Take 1 mg by mouth at bedtime.   Yes [provider]  esomeprazole (NEXIUM) 20 MG capsule Take 20 mg by mouth 2 (two) times daily before a meal.   Yes [provider]  fluticasone (FLONASE) 50 MCG/ACT nasal spray Place 2 sprays into both nostrils daily.   Yes [provider]  HYDROcodone-acetaminophen (NORCO/VICODIN) 5-325 MG tablet Take 1 tablet by mouth every 4 (four)  hours as needed for moderate pain. 04/20/18  Yes Cuthriell, Charline Bills, PA-C  metoprolol (LOPRESSOR) 50 MG tablet Take 50 mg by mouth 2 (two) times daily.   Yes [provider]  sildenafil (REVATIO) 20 MG tablet 2-5 tabs 1 hour prior to intercourse 05/01/18  Yes Stoioff, Ronda Fairly, MD  simvastatin (ZOCOR) 40 MG tablet Take 40 mg by mouth daily at 6 PM.   Yes [provider]  latanoprost (XALATAN) 0.005 % ophthalmic solution Place 1 drop into the right eye at bedtime. Patient not taking: Reported on 02/04/2020 01/24/20   [provider]  olmesartan (BENICAR) 20 MG tablet Take 20 mg by mouth daily. Patient not taking: Reported on 02/04/2020 11/20/19   [provider]  olmesartan (BENICAR) 20 MG tablet Take by mouth. Patient not taking: Reported on 02/04/2020 09/30/19   [provider]  tamsulosin (FLOMAX) 0.4 MG CAPS capsule Take 1 capsule (0.4 mg total) by mouth daily. Patient not taking: Reported on 02/04/2020 07/14/19   Abbie Sons, MD  temazepam (RESTORIL) 30 MG capsule Take 30 mg by mouth at bedtime as needed for sleep. Patient not taking: Reported on 02/04/2020    [provider]  triamcinolone cream (KENALOG) 0.1 % SMARTSIG:1 Application Topical 2-3 Times Daily Patient not taking: Reported on 02/04/2020 01/27/20   [provider]    Allergies: Allergies  Allergen Reactions  . Ace Inhibitors  Social History:  reports that he has never smoked. He has never used smokeless tobacco. He reports current alcohol use. He reports that he does not use drugs.   Family History: History reviewed. No pertinent family history.  Review of Systems: Review of Systems  Constitutional: Negative for chills and fever.  Respiratory: Negative for shortness of breath.   Cardiovascular: Negative for chest pain.  Musculoskeletal: Negative for joint pain and myalgias.  Skin: Negative for rash.  Neurological: Negative for sensory change and focal  weakness.    Physical Exam BP (!) 146/93   Pulse 70   Temp 98 F (36.7 C) (Oral)   Ht 5\' 11"  (1.803 m)   Wt 208 lb 12.8 oz (94.7 kg)   SpO2 97%   BMI 29.12 kg/m  CONSTITUTIONAL: No acute distress, well nourished RESPIRATORY:  Lungs are clear, and breath sounds are equal bilaterally. Normal respiratory effort without pathologic use of accessory muscles. CARDIOVASCULAR: Heart is regular without murmurs, gallops, or rubs.  MUSCULOSKELETAL:  Normal muscle strength and tone in all four extremities.  No peripheral edema or cyanosis. SKIN: Patient has an approximately 7 x 6 cm mass in the left shoulder, over the superior portion of the scapula, over the supraspinatus muscle.  It is mobile, soft, non-tender. NEUROLOGIC:  Motor and sensation is grossly normal.  Cranial nerves are grossly intact. PSYCH:  Alert and oriented to person, place and time. Affect is normal.  Laboratory Analysis: No results found for this or any previous visit (from the past 24 hour(s)).  Imaging: No results found.  Assessment and Plan: This is a 79 y.o. 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  Given his lack of symptoms, he's not particularly interested in having surgery to remove it, and I would agree that there's no need for excision at this point.  However, discussed with him that if he changes his mind, or if the mass grows more, or if it starts causing some discomfort/pain, to let us know so we can see him again and discuss excision. --Follow up prn  Face-to-face time spent with the patient and care providers was 40 minutes, with more than 50% of the time spent counseling, educating, and coordinating care of the patient.     Melvyn Neth, [facility]

## 2020-02-04 NOTE — Patient Instructions (Addendum)
Dr.Piscoya discussed with patient that as long as the area of concern isn't bothering him, they will not do anything to that area right now. He can keep an eye on it. If patient does notice unexplainable growth or changes of the Lipoma. Patient advise to give our office a call. Lipoma  A lipoma is a noncancerous (benign) tumor that is made up of fat cells. This is a very common type of soft-tissue growth. Lipomas are usually found under the skin (subcutaneous). They may occur in any tissue of the body that contains fat. Common areas for lipomas to appear include the back, arms, shoulders, buttocks, and thighs. Lipomas grow slowly, and they are usually painless. Most lipomas do not cause problems and do not require treatment. What are the causes? The cause of this condition is not known. What increases the risk? You are more likely to develop this condition if:  You are 28-65 years old.  You have a family history of lipomas. What are the signs or symptoms? A lipoma usually appears as a small, round bump under the skin. In most cases, the lump will:  Feel soft or rubbery.  Not cause pain or other symptoms. However, if a lipoma is located in an area where it pushes on nerves, it can become painful or cause other symptoms. How is this diagnosed? A lipoma can usually be diagnosed with a physical exam. You may also have tests to confirm the diagnosis and to rule out other conditions. Tests may include:  Imaging tests, such as a CT scan or an MRI.  Removal of a tissue sample to be looked at under a microscope (biopsy). How is this treated? Treatment for this condition depends on the size of the lipoma and whether it is causing any symptoms.  For small lipomas that are not causing problems, no treatment is needed.  If a lipoma is bigger or it causes problems, surgery may be done to remove the lipoma. Lipomas can also be removed to improve appearance. Most often, the procedure is done after  applying a medicine that numbs the area (local anesthetic).  Liposuction may be done to reduce the size of the lipoma before it is removed through surgery, or it may be done to remove the lipoma. Lipomas are removed with this method in order to limit incision size and scarring. A liposuction tube is inserted through a small incision into the lipoma, and the contents of the lipoma are removed through the tube with suction. Follow these instructions at home:  Watch your lipoma for any changes.  Keep all follow-up visits as told by your health care provider. This is important. Contact a health care provider if:  Your lipoma becomes larger or hard.  Your lipoma becomes painful, red, or increasingly swollen. These could be signs of infection or a more serious condition. Get help right away if:  You develop tingling or numbness in an area near the lipoma. This could indicate that the lipoma is causing nerve damage. Summary  A lipoma is a noncancerous tumor that is made up of fat cells.  Most lipomas do not cause problems and do not require treatment.  If a lipoma is bigger or it causes problems, surgery may be done to remove the lipoma.  Contact a health care provider if your lipoma becomes larger or hard, or if it becomes painful, red, or increasingly swollen. Pain, redness, and swelling could be signs of infection or a more serious condition. This information is not intended  to replace advice given to you by your health care provider. Make sure you discuss any questions you have with your health care provider. Document Revised: 12/23/2018 Document Reviewed: 12/23/2018 Elsevier Patient Education  [place].

## 2020-04-21 ENCOUNTER — Encounter: Payer: Self-pay | Admitting: Physician Assistant

## 2020-04-21 ENCOUNTER — Other Ambulatory Visit: Payer: Self-pay

## 2020-04-21 ENCOUNTER — Ambulatory Visit (INDEPENDENT_AMBULATORY_CARE_PROVIDER_SITE_OTHER): Payer: Medicare Other | Admitting: Physician Assistant

## 2020-04-21 VITALS — BP 153/90 | HR 106 | Temp 97.9°F | Ht 71.0 in

## 2020-04-21 DIAGNOSIS — L02215 Cutaneous abscess of perineum: Secondary | ICD-10-CM

## 2020-04-21 NOTE — Progress Notes (Signed)
04/21/2020 3:00 PM   Tyler Walls 07-Feb-1941 [id number]  CC: Chief Complaint  Patient presents with  . Abscess    HPI: Tyler Walls is a 79 y.o. male with PMH CKD, CAD, ED, left spermatocele, and nephrolithiasis who presents today for evaluation of possible scrotal abscess.  Today he reports a 5-day history of posterior scrotal pain and tenderness. He has noted small volumes of drainage in his underwear during this time. He denies fever, nausea, and vomiting; he reports chills for the past two days. He is not diabetic and is a nonsmoker.  He originally sought care with his PCP yesterday for evaluation of the same and was instructed to hold Eliquis (now off for 2 days) and start doxycycline with plans for possible I&D in our clinic today.  In office UA today notable for trace intact blood; urine microscopy pan negative.  PMH: Past Medical History:  Diagnosis Date  . Asthma   . Chronic kidney disease   . Coronary artery disease   . GERD (gastroesophageal reflux disease)   . Hypertension     Surgical History: Past Surgical History:  Procedure Laterality Date  . CHOLECYSTECTOMY    . COLONOSCOPY WITH PROPOFOL N/A 06/26/2018   Procedure: COLONOSCOPY WITH PROPOFOL;  Surgeon: Toledo, Benay Pike, MD;  Location: [facility] ENDOSCOPY;  Service: Gastroenterology;  Laterality: N/A;  . EYE SURGERY    . TRANSURETHRAL RESECTION OF PROSTATE      Home Medications:  Allergies as of 04/21/2020      Reactions   Ace Inhibitors       Medication List       Accurate as of April 21, 2020  3:00 PM. If you have any questions, ask your nurse or doctor.        STOP taking these medications   latanoprost 0.005 % ophthalmic solution Commonly known as: XALATAN Stopped by: Debroah Loop, PA-C   olmesartan 20 MG tablet Commonly known as: BENICAR Stopped by: Debroah Loop, PA-C   temazepam 30 MG capsule Commonly known as: RESTORIL Stopped by: Debroah Loop, PA-C     triamcinolone 0.1 % Commonly known as: KENALOG Stopped by: Debroah Loop, PA-C     TAKE these medications   allopurinol 100 MG tablet Commonly known as: ZYLOPRIM Take 200 mg by mouth daily.   amLODipine 5 MG tablet Commonly known as: NORVASC Take 5 mg by mouth daily.   aspirin EC 81 MG tablet Take 81 mg by mouth daily.   clonazePAM 1 MG tablet Commonly known as: KLONOPIN Take 1 mg by mouth at bedtime.   doxycycline 100 MG tablet Commonly known as: VIBRA-TABS Take 100 mg by mouth 2 (two) times daily.   Eliquis 5 MG Tabs tablet Generic drug: apixaban Take 5 mg by mouth 2 (two) times daily.   esomeprazole 20 MG capsule Commonly known as: NEXIUM Take 20 mg by mouth 2 (two) times daily before a meal.   fluticasone 50 MCG/ACT nasal spray Commonly known as: FLONASE Place 2 sprays into both nostrils daily.   HYDROcodone-acetaminophen 5-325 MG tablet Commonly known as: NORCO/VICODIN Take 1 tablet by mouth every 4 (four) hours as needed for moderate pain.   metoprolol tartrate 50 MG tablet Commonly known as: LOPRESSOR Take 50 mg by mouth 2 (two) times daily.   sildenafil 20 MG tablet Commonly known as: REVATIO 2-5 tabs 1 hour prior to intercourse   simvastatin 40 MG tablet Commonly known as: ZOCOR Take 40 mg by mouth daily at 6 PM.  tamsulosin 0.4 MG Caps capsule Commonly known as: FLOMAX Take by mouth. What changed: Another medication with the same name was removed. Continue taking this medication, and follow the directions you see here. Changed by: Debroah Loop, PA-C       Allergies:  Allergies  Allergen Reactions  . Ace Inhibitors     Family History: No family history on file.  Social History:   reports that he has never smoked. He has never used smokeless tobacco. He reports current alcohol use. He reports that he does not use drugs.  Physical Exam: BP (!) 153/90   Pulse (!) 106   Temp 97.9 F (36.6 C)   Ht 5\' 11"  (1.803 m)    BMI 29.12 kg/m   Constitutional:  Alert and oriented, no acute distress, nontoxic appearing HEENT: [place], AT Cardiovascular: No clubbing, cyanosis, or edema Respiratory: Normal respiratory effort, no increased work of breathing GU: Large, nontender, intact left spermatocele without erythema or warmth. Approximate 3x4cm superficial region of fluctuance over the left perineum with purulent drainage visible. Skin: No rashes, bruises or suspicious lesions Neurologic: Grossly intact, no focal deficits, moving all 4 extremities Psychiatric: Normal mood and affect  Assessment & Plan:   1. Perineal abscess Not involving the scrotum. Concern for possible perirectal involvement. Referring to General Surgery for further evaluation. Counseled patient to continue doxycycline and hold Eliquis pending GenSurg eval. - Urinalysis, Complete - Ambulatory referral to General Surgery   Return if symptoms worsen or fail to improve.  Debroah Loop, PA-C  [facility] [address], [place], [place] [address] [phone]

## 2020-04-21 NOTE — Patient Instructions (Addendum)
The abscess you have is not related to your spermatocele and is located behind your scrotum. I am referring you to General Surgery for further evaluation; will see you tomorrow.  Please continue doxycycline and do not take Eliquis until you are seen by General Surgery.

## 2020-04-22 ENCOUNTER — Ambulatory Visit (INDEPENDENT_AMBULATORY_CARE_PROVIDER_SITE_OTHER): Payer: Medicare Other | Admitting: General Surgery

## 2020-04-22 ENCOUNTER — Encounter: Payer: Self-pay | Admitting: General Surgery

## 2020-04-22 VITALS — BP 158/107 | HR 83 | Temp 97.8°F | Ht 71.0 in | Wt 213.6 lb

## 2020-04-22 DIAGNOSIS — L02215 Cutaneous abscess of perineum: Secondary | ICD-10-CM | POA: Insufficient documentation

## 2020-04-22 NOTE — Progress Notes (Addendum)
Patient ID: [place], male   DOB: [birth year], 79 y.o.   MRN: [id number]  Chief Complaint  Patient presents with   New Patient (Initial Visit)    perineal abscess    HPI [place] is a 79 y.o. male.   He reports that a little over a week ago, he noticed an abscess near his scrotum. He saw his primary care provider who prescribed doxycycline and referred him to urology. He was seen yesterday in urology and the physicians assistant was concerned that there could be anorectal involvement and therefore referred him to general surgery. He endorses pain, particularly with sitting. The pain is localized to the area of the lesion. He says that it has been draining spontaneously and shows me his underwear that has yellowish fluid in the crotch. He states that he has not noticed significant improvement. He does report that he is cleaning himself well after having bowel movements. He denies constipation or diarrhea. No fevers, but he did report some mild chills. He has never had a similar episode in the past.   Past Medical History:  Diagnosis Date   Asthma    Chronic kidney disease    Coronary artery disease    GERD (gastroesophageal reflux disease)    Hypertension     Past Surgical History:  Procedure Laterality Date   CHOLECYSTECTOMY     COLONOSCOPY WITH PROPOFOL N/A 06/26/2018   Procedure: COLONOSCOPY WITH PROPOFOL;  Surgeon: Toledo, Benay Pike, MD;  Location: [facility] ENDOSCOPY;  Service: Gastroenterology;  Laterality: N/A;   EYE SURGERY     TRANSURETHRAL RESECTION OF PROSTATE      Family History  Problem Relation Age of Onset   Diabetes Mother     Social History Social History   Tobacco Use   Smoking status: Never Smoker   Smokeless tobacco: Never Used  Substance Use Topics   Alcohol use: Yes   Drug use: Never    Allergies  Allergen Reactions   Ace Inhibitors     Current Outpatient Medications  Medication Sig Dispense Refill   allopurinol (ZYLOPRIM) 100  MG tablet Take 200 mg by mouth daily.     amLODipine (NORVASC) 5 MG tablet Take 5 mg by mouth daily.     clonazePAM (KLONOPIN) 1 MG tablet Take 1 mg by mouth at bedtime.     doxycycline (VIBRA-TABS) 100 MG tablet Take 100 mg by mouth 2 (two) times daily.     ELIQUIS 5 MG TABS tablet Take 5 mg by mouth 2 (two) times daily.     esomeprazole (NEXIUM) 20 MG capsule Take 20 mg by mouth 2 (two) times daily before a meal.     fluticasone (FLONASE) 50 MCG/ACT nasal spray Place 2 sprays into both nostrils daily.     HYDROcodone-acetaminophen (NORCO/VICODIN) 5-325 MG tablet Take 1 tablet by mouth every 4 (four) hours as needed for moderate pain. 20 tablet 0   metoprolol (LOPRESSOR) 50 MG tablet Take 50 mg by mouth 2 (two) times daily.     sildenafil (REVATIO) 20 MG tablet 2-5 tabs 1 hour prior to intercourse 10 tablet 0   simvastatin (ZOCOR) 40 MG tablet Take 40 mg by mouth daily at 6 PM.     tamsulosin (FLOMAX) 0.4 MG CAPS capsule Take by mouth.     aspirin EC 81 MG tablet Take 81 mg by mouth daily. (Patient not taking: Reported on 04/22/2020)     No current facility-administered medications for this visit.    Review of  Systems Review of Systems  Genitourinary: Positive for frequency.       Pain in the area of the abscess.  Neurological: Positive for headaches.  All other systems reviewed and are negative. Or as discussed in the history of present illness  Blood pressure (!) 158/107, pulse 83, temperature 97.8 F (36.6 C), temperature source Oral, height 5\' 11"  (1.803 m), weight 213 lb 9.6 oz (96.9 kg), SpO2 97 %. Body mass index is 29.79 kg/m.  Physical Exam Physical Exam Vitals reviewed. Exam conducted with a chaperone present.  Constitutional:      General: He is not in acute distress.    Appearance: Normal appearance.  HENT:     Head: Normocephalic and atraumatic.     Nose:     Comments: Covered with a mask    Mouth/Throat:     Comments: Covered with a mask Eyes:      General: No scleral icterus.       Right eye: No discharge.        Left eye: No discharge.     Comments: Wearing glasses  Cardiovascular:     Rate and Rhythm: Normal rate and regular rhythm.     Pulses: Normal pulses.  Pulmonary:     Effort: Pulmonary effort is normal.     Breath sounds: Normal breath sounds.  Abdominal:     General: Bowel sounds are normal.     Palpations: Abdomen is soft.  Genitourinary:      Comments: There is an abscess in his perineum, well away from the anal verge. No rectal involvement. It is open and draining serosanguineous fluid. It was probed with a cotton tip applicator and there is no tracking toward the anus. The cavity is less than half a centimeter. There is some fibrinous exudate at the opening. Musculoskeletal:        General: No swelling or tenderness.     Cervical back: No rigidity.  Lymphadenopathy:     Cervical: No cervical adenopathy.  Skin:    General: Skin is warm and dry.  Neurological:     General: No focal deficit present.     Mental Status: He is alert and oriented to person, place, and time.  Psychiatric:        Mood and Affect: Mood normal.        Behavior: Behavior normal.     Data Reviewed I reviewed the clinic note from his primary care provider from April 20, 2020. His physical exam describes an enlarged scrotal sac, softball sized with tender erythematous indurated area towards the posterior aspect with some bloody discharge noted. He prescribed doxycycline for a 10-day course.  The note from yesterday's office visit with urology was also reviewed. The physical exam findings include a large, nontender, intact left spermatocele without erythema or warmth. She describes an approximate 3 x 4 cm superficial region of fluctuance over the left perineum with purulent drainage visible. Her assessment and plan determined that this lesion did not involve the scrotum and she was concerned for perirectal involvement, resulting in the  referral to general surgery.  Assessment This is an 79 year old man with a perineal abscess. It appears likely to have been due to mild local trauma or an ingrown hair. It has opened and spontaneously drained. The cavity was probed and there is no additional purulence expressible. It does not track towards the anorectal area. There is a small amount of fibrinous exudate. There is no need for further incision or drainage.  Plan  [place] was instructed to take Epsom salt sitz baths on at least a daily basis. He was encouraged to continue good perianal hygiene with his bowel movements. He should complete the course of antibiotics prescribed by his primary care doctor. We suggested the use of a panty liner to prevent further soilage of his underclothing. We will see him on an as-needed basis.    Fredirick Maudlin 04/22/2020, 12:35 PM

## 2020-04-22 NOTE — Patient Instructions (Signed)
Get a disposable sitz bath or use bathtub and use it once a day with epsom salt. Finish your antibiotics, doxycycline. Make sure to wash yourself good after each bowel movement. You can also use a panty liner to catch any remaining drainage. If you have any questions or concerns, please feel free to call our office.   How to Take a [employer] A sitz bath is a warm water bath that may be used to care for your rectum, genital area, or the area between your rectum and genitals (perineum). For a sitz bath, the water only comes up to your hips and covers your buttocks. A sitz bath may done at home in a bathtub or with a portable sitz bath that fits over the toilet. Your health care provider may recommend a sitz bath to help:  Relieve pain and discomfort after delivering a baby.  Relieve pain and itching from hemorrhoids or anal fissures.  Relieve pain after certain surgeries.  Relax muscles that are sore or tight. How to take a sitz bath Take 3-4 sitz baths a day, or as many as told by your health care provider. Bathtub sitz bath To take a sitz bath in a bathtub: 1. Partially fill a bathtub with warm water. The water should be deep enough to cover your hips and buttocks when you are sitting in the tub. 2. If your health care provider told you to put medicine in the water, follow his or her instructions. 3. Sit in the water. 4. Open the tub drain a little, and leave it open during your bath. 5. Turn on the warm water again, enough to replace the water that is draining out. Keep the water running throughout your bath. This helps keep the water at the right level and the right temperature. 6. Soak in the water for 15-20 minutes, or as long as told by your health care provider. 7. When you are done, be careful when you stand up. You may feel dizzy. 8. After the sitz bath, pat yourself dry. Do not rub your skin to dry it.  Over-the-toilet sitz bath To take a sitz bath with an over-the-toilet  basin: 1. Follow the manufacturer's instructions. 2. Fill the basin with warm water. 3. If your health care provider told you to put medicine in the water, follow his or her instructions. 4. Sit on the seat. Make sure the water covers your buttocks and perineum. 5. Soak in the water for 15-20 minutes, or as long as told by your health care provider. 6. After the sitz bath, pat yourself dry. Do not rub your skin to dry it. 7. Clean and dry the basin between uses. 8. Discard the basin if it cracks, or according to the manufacturer's instructions. Contact a health care provider if:  Your symptoms get worse. Do not continue with sitz baths if your symptoms get worse.  You have new symptoms. If this happens, do not continue with sitz baths until you talk with your health care provider. Summary  A sitz bath is a warm water bath in which the water only comes up to your hips and covers your buttocks.  A sitz bath may help relieve itching, relieve pain, and relax muscles that are sore or tight in the lower part of your body, including your genital area.  Take 3-4 sitz baths a day, or as many as told by your health care provider. Soak in the water for 15-20 minutes.  Do not continue with sitz baths if  your symptoms get worse. This information is not intended to replace advice given to you by your health care provider. Make sure you discuss any questions you have with your health care provider. Document Revised: 10/07/2018 Document Reviewed: 05/10/2017 Elsevier Patient Education  [place].

## 2020-04-23 LAB — URINALYSIS, COMPLETE
Bilirubin, UA: NEGATIVE
Glucose, UA: NEGATIVE
Ketones, UA: NEGATIVE
Leukocytes,UA: NEGATIVE
Nitrite, UA: NEGATIVE
Protein,UA: NEGATIVE
Specific Gravity, UA: <1.005 — ABNORMAL LOW (ref 1.005–1.030)
Urobilinogen, Ur: 0.2 mg/dL (ref 0.2–1.0)
pH, UA: 6 (ref 5.0–7.5)

## 2020-04-23 LAB — MICROSCOPIC EXAMINATION
Bacteria, UA: NONE SEEN
Epithelial Cells (non renal): NONE SEEN /hpf (ref 0–10)

## 2020-07-14 ENCOUNTER — Ambulatory Visit (INDEPENDENT_AMBULATORY_CARE_PROVIDER_SITE_OTHER): Payer: Medicare Other | Admitting: Urology

## 2020-07-14 ENCOUNTER — Ambulatory Visit
Admission: RE | Admit: 2020-07-14 | Discharge: 2020-07-14 | Disposition: A | Discharge status: Home / Self Care | Payer: Medicare Other | Attending: Urology | Admitting: Urology

## 2020-07-14 ENCOUNTER — Encounter: Payer: Self-pay | Admitting: Urology

## 2020-07-14 ENCOUNTER — Ambulatory Visit
Admission: RE | Admit: 2020-07-14 | Discharge: 2020-07-14 | Disposition: A | Discharge status: Home / Self Care | Payer: Medicare Other | Source: Ambulatory Visit | Attending: Urology | Admitting: Urology

## 2020-07-14 ENCOUNTER — Other Ambulatory Visit: Payer: Self-pay

## 2020-07-14 VITALS — BP 140/93 | HR 76 | Ht 71.0 in | Wt 211.0 lb

## 2020-07-14 DIAGNOSIS — N200 Calculus of kidney: Secondary | ICD-10-CM | POA: Diagnosis present

## 2020-07-14 DIAGNOSIS — R351 Nocturia: Secondary | ICD-10-CM

## 2020-07-14 DIAGNOSIS — N401 Enlarged prostate with lower urinary tract symptoms: Secondary | ICD-10-CM

## 2020-07-14 MED ORDER — TAMSULOSIN HCL 0.4 MG PO CAPS: 0.4000 mg | ORAL_CAPSULE | Freq: Every day | ORAL | 3 refills | Status: AC

## 2020-07-14 NOTE — Progress Notes (Signed)
07/14/2020 1:13 PM   [place] February 29, 1941 [id number]  Referring provider: Rusty Aus, MD [place] [facility] [place],  [place] [address]  Chief Complaint  Patient presents with  . Nephrolithiasis    Urologic history: 1.  Bilateral nephrolithiasis -CT 2019 with bilateral nonobstructing renal calculi  2.  BPH with nocturia -Tamsulosin daily  HPI: 80 y.o. male presents for annual follow-up.   At his visit last year he was complaining of increased nocturia and given a trial of tamsulosin which he states significantly decreased his episodes  No significant renal colic or flank pain since last years visit  Saw our PA and early December for a perineal abscess; refer to general surgery for evaluation of possible anorectal disease which was negative and abscess had spontaneously drained   PMH: Past Medical History:  Diagnosis Date  . Asthma   . Chronic kidney disease   . Coronary artery disease   . GERD (gastroesophageal reflux disease)   . Hypertension     Surgical History: Past Surgical History:  Procedure Laterality Date  . CHOLECYSTECTOMY    . COLONOSCOPY WITH PROPOFOL N/A 06/26/2018   Procedure: COLONOSCOPY WITH PROPOFOL;  Surgeon: Toledo, Benay Pike, MD;  Location: [facility] ENDOSCOPY;  Service: Gastroenterology;  Laterality: N/A;  . EYE SURGERY    . TRANSURETHRAL RESECTION OF PROSTATE      Home Medications:  Allergies as of 07/14/2020      Reactions   Ace Inhibitors       Medication List       Accurate as of July 14, 2020  1:13 PM. If you have any questions, ask your nurse or doctor.        allopurinol 100 MG tablet Commonly known as: ZYLOPRIM Take 200 mg by mouth daily.   amLODipine 5 MG tablet Commonly known as: NORVASC Take 5 mg by mouth daily.   aspirin EC 81 MG tablet Take 81 mg by mouth daily.   clonazePAM 1 MG tablet Commonly known as: KLONOPIN Take 1 mg by mouth at bedtime.   doxycycline 100  MG tablet Commonly known as: VIBRA-TABS Take 100 mg by mouth 2 (two) times daily.   Eliquis 5 MG Tabs tablet Generic drug: apixaban Take 5 mg by mouth 2 (two) times daily.   esomeprazole 20 MG capsule Commonly known as: NEXIUM Take 20 mg by mouth 2 (two) times daily before a meal.   fluticasone 50 MCG/ACT nasal spray Commonly known as: FLONASE Place 2 sprays into both nostrils daily.   HYDROcodone-acetaminophen 5-325 MG tablet Commonly known as: NORCO/VICODIN Take 1 tablet by mouth every 4 (four) hours as needed for moderate pain.   metoprolol tartrate 50 MG tablet Commonly known as: LOPRESSOR Take 50 mg by mouth 2 (two) times daily.   sildenafil 20 MG tablet Commonly known as: REVATIO 2-5 tabs 1 hour prior to intercourse   simvastatin 40 MG tablet Commonly known as: ZOCOR Take 40 mg by mouth daily at 6 PM.   tamsulosin 0.4 MG Caps capsule Commonly known as: FLOMAX Take by mouth.       Allergies:  Allergies  Allergen Reactions  . Ace Inhibitors     Family History: Family History  Problem Relation Age of Onset  . Diabetes Mother     Social History:  reports that he has never smoked. He has never used smokeless tobacco. He reports current alcohol use. He reports that he does not use drugs.   Physical Exam: BP Marland Kitchen)  140/93   Pulse 76   Ht 5\' 11"  (1.803 m)   Wt 211 lb (95.7 kg)   BMI 29.43 kg/m   Constitutional:  Alert and oriented, No acute distress. HEENT: [place] AT, moist mucus membranes.  Trachea midline, no masses. Cardiovascular: No clubbing, cyanosis, or edema. Respiratory: Normal respiratory effort, no increased work of breathing.  Pertinent imaging: KUB performed today was reviewed and there are stable, bilateral renal calculi.  A 2 mm left upper pole calculus seen on last years x-rays is not visualized.  No increased size or number of calcifications   Assessment & Plan:    1.  Bilateral nephrolithiasis  Stable  2.  BPH with  nocturia  Symptoms significantly improved on tamsulosin  Refills sent   Abbie Sons, MD  [facility] [address], [place], [place] [address] [phone]

## 2021-03-15 ENCOUNTER — Encounter: Payer: Self-pay | Admitting: General Surgery

## 2021-07-08 ENCOUNTER — Other Ambulatory Visit: Payer: Self-pay | Admitting: *Deleted

## 2021-07-08 DIAGNOSIS — N200 Calculus of kidney: Secondary | ICD-10-CM

## 2021-07-14 ENCOUNTER — Ambulatory Visit
Admission: RE | Admit: 2021-07-14 | Discharge: 2021-07-14 | Disposition: A | Discharge status: Home / Self Care | Payer: Medicare Other | Source: Ambulatory Visit | Attending: Urology | Admitting: Urology

## 2021-07-14 ENCOUNTER — Other Ambulatory Visit: Payer: Self-pay

## 2021-07-14 ENCOUNTER — Ambulatory Visit (INDEPENDENT_AMBULATORY_CARE_PROVIDER_SITE_OTHER): Payer: Medicare Other | Admitting: Urology

## 2021-07-14 ENCOUNTER — Encounter: Payer: Self-pay | Admitting: Urology

## 2021-07-14 VITALS — BP 147/78 | HR 80 | Ht 71.0 in | Wt 212.0 lb

## 2021-07-14 DIAGNOSIS — R351 Nocturia: Secondary | ICD-10-CM

## 2021-07-14 DIAGNOSIS — N401 Enlarged prostate with lower urinary tract symptoms: Secondary | ICD-10-CM

## 2021-07-14 DIAGNOSIS — N433 Hydrocele, unspecified: Secondary | ICD-10-CM | POA: Diagnosis not present

## 2021-07-14 DIAGNOSIS — N200 Calculus of kidney: Secondary | ICD-10-CM | POA: Insufficient documentation

## 2021-07-14 NOTE — Progress Notes (Signed)
07/14/2021 1:14 PM   MANSUR PATTI [birth year] [id number]  Referring provider: Rusty Aus, MD [place] [facility] [place],  [place] [id number]  Chief Complaint  Patient presents with   Nephrolithiasis    Urologic history: 1.  Bilateral nephrolithiasis -CT 2019 with bilateral nonobstructing renal calculi  2.  BPH with nocturia -Tamsulosin daily  HPI: 81 y.o. male presents for annual follow-up.  Doing well since last visit No bothersome LUTS Denies dysuria, gross hematuria Denies flank, abdominal or pelvic pain Long history of a left hydrocele which he states has enlarged since last visit and due to size complains of skin irritation on his medial thigh  PMH: Past Medical History:  Diagnosis Date   Asthma    Chronic kidney disease    Coronary artery disease    GERD (gastroesophageal reflux disease)    Hypertension     Surgical History: Past Surgical History:  Procedure Laterality Date   CHOLECYSTECTOMY     COLONOSCOPY WITH PROPOFOL N/A 06/26/2018   Procedure: COLONOSCOPY WITH PROPOFOL;  Surgeon: Toledo, Benay Pike, MD;  Location: [facility] ENDOSCOPY;  Service: Gastroenterology;  Laterality: N/A;   EYE SURGERY     TRANSURETHRAL RESECTION OF PROSTATE      Home Medications:  Allergies as of 07/14/2021       Reactions   Ace Inhibitors         Medication List        Accurate as of July 14, 2021  1:14 PM. If you have any questions, ask your nurse or doctor.          allopurinol 100 MG tablet Commonly known as: ZYLOPRIM Take 200 mg by mouth daily.   amLODipine 5 MG tablet Commonly known as: NORVASC Take 5 mg by mouth daily.   aspirin EC 81 MG tablet Take 81 mg by mouth daily.   clonazePAM 1 MG tablet Commonly known as: KLONOPIN Take 1 mg by mouth at bedtime.   doxycycline 100 MG tablet Commonly known as: VIBRA-TABS Take 100 mg by mouth 2 (two) times daily.   Eliquis 5 MG Tabs tablet Generic drug:  apixaban Take 5 mg by mouth 2 (two) times daily.   esomeprazole 20 MG capsule Commonly known as: NEXIUM Take 20 mg by mouth 2 (two) times daily before a meal.   fluticasone 50 MCG/ACT nasal spray Commonly known as: FLONASE Place 2 sprays into both nostrils daily.   HYDROcodone-acetaminophen 5-325 MG tablet Commonly known as: NORCO/VICODIN Take 1 tablet by mouth every 4 (four) hours as needed for moderate pain.   metoprolol tartrate 50 MG tablet Commonly known as: LOPRESSOR Take 50 mg by mouth 2 (two) times daily.   sildenafil 20 MG tablet Commonly known as: REVATIO 2-5 tabs 1 hour prior to intercourse   simvastatin 40 MG tablet Commonly known as: ZOCOR Take 40 mg by mouth daily at 6 PM.   tamsulosin 0.4 MG Caps capsule Commonly known as: FLOMAX Take 1 capsule (0.4 mg total) by mouth daily after breakfast.        Allergies:  Allergies  Allergen Reactions   Ace Inhibitors     Family History: Family History  Problem Relation Age of Onset   Diabetes Mother     Social History:  reports that he has never smoked. He has never used smokeless tobacco. He reports current alcohol use. He reports that he does not use drugs.   Physical Exam: BP (!) 147/78    Pulse 80  Ht 5\' 11"  (1.803 m)    Wt 212 lb (96.2 kg)    BMI 29.57 kg/m   Constitutional:  Alert and oriented, No acute distress. HEENT: [place] AT, moist mucus membranes.  Trachea midline, no masses. Cardiovascular: No clubbing, cyanosis, or edema. Respiratory: Normal respiratory effort, no increased work of breathing.  Pertinent imaging: KUB performed today was reviewed and there are stable, bilateral renal calculi.  It appears a small right midpole calcification has migrated to a lower pole calyx  Assessment & Plan:    1.  Bilateral nephrolithiasis Stable  2.  BPH with nocturia Stable on tamsulosin  3.  Left hydrocele Large left hydrocele We discussed hydrocelectomy and potential risks of  bleeding/hematoma, infection/abscess, recurrence.  The possibility of fairly significant postoperative swelling was discussed as well as the need for a postoperative drain.  The alternative of hydrocele aspiration was discussed though the recurrence rate is high.  He has initially requested aspiration and will schedule   Abbie Sons, MD  [facility] [address] [place], [place] [address] [phone]

## 2021-07-25 ENCOUNTER — Other Ambulatory Visit: Payer: Medicare Other | Admitting: Urology

## 2021-08-03 ENCOUNTER — Other Ambulatory Visit: Payer: Self-pay

## 2021-08-03 ENCOUNTER — Encounter: Payer: Self-pay | Admitting: Urology

## 2021-08-03 ENCOUNTER — Ambulatory Visit (INDEPENDENT_AMBULATORY_CARE_PROVIDER_SITE_OTHER): Payer: Medicare Other | Admitting: Urology

## 2021-08-03 VITALS — BP 139/79 | HR 65 | Ht 71.0 in | Wt 210.0 lb

## 2021-08-03 DIAGNOSIS — N401 Enlarged prostate with lower urinary tract symptoms: Secondary | ICD-10-CM

## 2021-08-03 DIAGNOSIS — R351 Nocturia: Secondary | ICD-10-CM

## 2021-08-03 MED ORDER — GENTAMICIN SULFATE 40 MG/ML IJ SOLN: 80.0000 mg | Freq: Once | INTRAMUSCULAR | Status: DC

## 2021-08-03 MED ORDER — LEVOFLOXACIN 500 MG PO TABS: 500.0000 mg | ORAL_TABLET | Freq: Once | ORAL | Status: DC

## 2021-08-03 NOTE — Progress Notes (Signed)
Procedure note ? ?Diagnosis: Hydrocele ? ?Indications: Symptomatic hydrocele ? ?Procedure: Hydrocele aspiration ? ?Anesthesia: 1% plain Xylocaine ? ?Description: Scrotal ultrasound was performed to determine testis location.  The anterior scrotal skin was anesthetized with 1 mL of 1% plain Xylocaine.  A 20-gauge angiocatheter was inserted through the anesthetized area.  The needle was removed and 350 mL of clear fluid was aspirated. ? ?Complications: None ? ?Plan:  ?We discussed the possibility of hydrocele recurrence ?Consider hydrocelectomy if early recurrence and hydrocele as well as ?Call for fever, scrotal erythema ? ? ? ?John Giovanni, MD ? ?

## 2022-06-28 ENCOUNTER — Ambulatory Visit (INDEPENDENT_AMBULATORY_CARE_PROVIDER_SITE_OTHER): Payer: Medicare Other | Admitting: Urology

## 2022-06-28 ENCOUNTER — Other Ambulatory Visit: Payer: Self-pay | Admitting: Internal Medicine

## 2022-06-28 ENCOUNTER — Encounter: Payer: Self-pay | Admitting: Urology

## 2022-06-28 ENCOUNTER — Ambulatory Visit
Admission: RE | Admit: 2022-06-28 | Discharge: 2022-06-28 | Disposition: A | Discharge status: Home / Self Care | Payer: Medicare Other | Source: Ambulatory Visit | Attending: Internal Medicine | Admitting: Internal Medicine

## 2022-06-28 VITALS — BP 140/72 | HR 68 | Ht 71.0 in | Wt 211.0 lb

## 2022-06-28 DIAGNOSIS — K35891 Other acute appendicitis without perforation, with gangrene: Secondary | ICD-10-CM | POA: Insufficient documentation

## 2022-06-28 DIAGNOSIS — N23 Unspecified renal colic: Secondary | ICD-10-CM | POA: Diagnosis not present

## 2022-06-28 DIAGNOSIS — K352 Acute appendicitis with generalized peritonitis, without perforation or abscess: Secondary | ICD-10-CM | POA: Diagnosis present

## 2022-06-28 DIAGNOSIS — R1031 Right lower quadrant pain: Secondary | ICD-10-CM | POA: Diagnosis not present

## 2022-06-28 DIAGNOSIS — N132 Hydronephrosis with renal and ureteral calculous obstruction: Secondary | ICD-10-CM | POA: Diagnosis not present

## 2022-06-28 DIAGNOSIS — N200 Calculus of kidney: Secondary | ICD-10-CM

## 2022-06-28 DIAGNOSIS — N201 Calculus of ureter: Secondary | ICD-10-CM

## 2022-06-28 MED ORDER — HYDROCODONE-ACETAMINOPHEN 5-325 MG PO TABS: 0.5000 | ORAL_TABLET | Freq: Four times a day (QID) | ORAL | 0 refills | Status: DC | PRN

## 2022-06-28 MED ORDER — TAMSULOSIN HCL 0.4 MG PO CAPS: 0.4000 mg | ORAL_CAPSULE | Freq: Every day | ORAL | 0 refills | Status: AC

## 2022-06-28 NOTE — Progress Notes (Signed)
06/28/2022 6:03 PM   Tyler Walls 04/03/1941 [id number]  Referring provider: Rusty Aus, MD [facility] [place],  [place] [address]  Chief Complaint  Patient presents with   Benign Prostatic Hypertrophy    HPI: 82 y.o. seen earlier today at PCP for abdominal pain and referred for evaluation of a ureteral calculus.  Has been followed annually for bilateral nonobstructing renal calculi, left hydrocele and BPH Onset of right lower quadrant abdominal pain radiating to the right groin region 06/25/2022.  Pain has been intermittent and at its worse rated 7/10 Denies nausea, vomiting, fever or chills No bothersome LUTS No dysuria or gross hematuria Saw Dr. Sabra Heck this morning and CT abdomen/pelvis was performed which showed a 5 x 7 mm right distal ureteral calculus with mild-moderate hydronephrosis/hydroureter Presently with minimal pain   PMH: Past Medical History:  Diagnosis Date   Asthma    Chronic kidney disease    Coronary artery disease    GERD (gastroesophageal reflux disease)    Hypertension     Surgical History: Past Surgical History:  Procedure Laterality Date   CHOLECYSTECTOMY     COLONOSCOPY WITH PROPOFOL N/A 06/26/2018   Procedure: COLONOSCOPY WITH PROPOFOL;  Surgeon: Toledo, Benay Pike, MD;  Location: [facility] ENDOSCOPY;  Service: Gastroenterology;  Laterality: N/A;   EYE SURGERY     TRANSURETHRAL RESECTION OF PROSTATE      Home Medications:  Allergies as of 06/28/2022       Reactions   Ace Inhibitors         Medication List        Accurate as of June 28, 2022  6:03 PM. If you have any questions, ask your nurse or doctor.          allopurinol 100 MG tablet Commonly known as: ZYLOPRIM Take 200 mg by mouth daily.   amLODipine 5 MG tablet Commonly known as: NORVASC Take 5 mg by mouth daily.   aspirin EC 81 MG tablet Take 81 mg by mouth daily.   clonazePAM 1 MG tablet Commonly known as:  KLONOPIN Take by mouth.   doxycycline 100 MG capsule Commonly known as: VIBRAMYCIN Take 100 mg by mouth 2 (two) times daily.   Eliquis 5 MG Tabs tablet Generic drug: apixaban Take 5 mg by mouth 2 (two) times daily.   finasteride 5 MG tablet Commonly known as: PROSCAR Take 5 mg by mouth daily.   fluticasone 50 MCG/ACT nasal spray Commonly known as: FLONASE Place 2 sprays into both nostrils daily.   HYDROcodone-acetaminophen 5-325 MG tablet Commonly known as: NORCO/VICODIN Take 1 tablet by mouth every 4 (four) hours as needed for moderate pain.   metoprolol tartrate 50 MG tablet Commonly known as: LOPRESSOR Take 50 mg by mouth 2 (two) times daily.   omeprazole 20 MG capsule Commonly known as: PRILOSEC Take 20 mg by mouth daily.   sildenafil 20 MG tablet Commonly known as: REVATIO 2-5 tabs 1 hour prior to intercourse   simvastatin 40 MG tablet Commonly known as: ZOCOR Take 40 mg by mouth daily at 6 PM.        Allergies:  Allergies  Allergen Reactions   Ace Inhibitors     Family History: Family History  Problem Relation Age of Onset   Diabetes Mother     Social History:  reports that he has never smoked. He has never used smokeless tobacco. He reports current alcohol use. He reports that he does not use drugs.  Physical Exam: BP (!) 140/72   Pulse 68   Ht '5\' 11"'$  (1.803 m)   Wt 211 lb (95.7 kg)   BMI 29.43 kg/m   Constitutional:  Alert, No acute distress. HEENT: [place] AT Respiratory: Normal respiratory effort, no increased work of breathing. GI: Abdomen is soft, nontender, nondistended, no abdominal masses GU: No CVA tenderness Psychiatric: Normal mood and affect.  Laboratory Data:  Urinalysis Pending   Pertinent Imaging: CT images were personally reviewed and interpreted.  The right ureteral calculus has migrated from the kidney when reviewing a prior KUB February 2023   Assessment & Plan:    1.  Right ureteral calculus We discussed  various treatment options for urolithiasis including observation with or without medical expulsive therapy, shockwave lithotripsy (SWL), ureteroscopy and laser lithotripsy with stent placement. We discussed that management is based on stone size, location, density, patient co-morbidities, and patient preference.  Stones <32m in size have a >80% spontaneous passage rate. Data surrounding the use of tamsulosin for medical expulsive therapy is controversial, but meta analyses suggests it is most efficacious for distal stones between 5-142min size. Possible side effects include dizziness/lightheadedness, and retrograde ejaculation. SWL has a lower stone free rate in a single procedure, but also a lower complication rate compared to ureteroscopy and avoids a stent and associated stent related symptoms. Possible complications include renal hematoma, steinstrasse, and need for additional treatment. Ureteroscopy with laser lithotripsy and stent placement has a higher stone free rate than SWL in a single procedure, however increased complication rate including possible infection, ureteral injury, bleeding, and stent related morbidity. Common stent related symptoms include dysuria, urgency/frequency, and flank pain. After an extensive discussion of the risks and benefits of the above treatment options, the patient would like to proceed with MET.  He was previously on tamsulosin for BPH but now taking finasteride.  He states he tolerated the tamsulosin without problems and Rx was sent to pharmacy.  Rx hydrocodone was also sent Follow-up KUB 07/03/2022.  If no stone progression would recommend SWL or ureteroscopy.  He is more interested in SWL Instructed to call earlier or proceed to ED for worsening pain or development of fever greater than 100.5 degrees    ScAbbie SonsMD  [place] [phone]

## 2022-06-28 NOTE — Patient Instructions (Signed)
[facility] for [facility]-Ray. Go to the registration desk and tell them you are there for x ray.

## 2022-06-29 ENCOUNTER — Other Ambulatory Visit: Payer: Medicare Other

## 2022-06-29 ENCOUNTER — Telehealth: Payer: Self-pay | Admitting: Urology

## 2022-06-29 ENCOUNTER — Other Ambulatory Visit: Payer: Self-pay | Admitting: *Deleted

## 2022-06-29 DIAGNOSIS — N201 Calculus of ureter: Secondary | ICD-10-CM

## 2022-06-29 NOTE — Telephone Encounter (Signed)
LMOM informed patient on our end the RX was sent to the pharmacy and there is a E-Prescribing Status: Receipt confirmed by pharmacy (06/28/2022  6:19 PM EST)

## 2022-06-29 NOTE — Telephone Encounter (Signed)
Pt states that the Pharmacy [facility] did not receive the script for HYDROcodone-acetaminophen (NORCO/VICODIN) 5-325 MG tablet as of yesterday.  Please advise  [phone]

## 2022-06-30 ENCOUNTER — Telehealth: Payer: Self-pay

## 2022-06-30 ENCOUNTER — Other Ambulatory Visit: Payer: Medicare Other

## 2022-06-30 DIAGNOSIS — N201 Calculus of ureter: Secondary | ICD-10-CM

## 2022-06-30 NOTE — Telephone Encounter (Signed)
Pt calls triage line and states that he has been urinating blood and is unsure if he needs to be concerned. He denies fever, chills, or vomiting. He has had mild nausea. Denies clots in urine. He believes he has passed stone. Urine color alternates between dark red and a lighter red. He states that he brought a urine specimen into the office but I do not see that it has been collected.

## 2022-07-01 LAB — MICROSCOPIC EXAMINATION: RBC, Urine: >30 /hpf — AB (ref 0–2)

## 2022-07-01 LAB — URINALYSIS, COMPLETE

## 2022-07-03 ENCOUNTER — Ambulatory Visit
Admission: RE | Admit: 2022-07-03 | Discharge: 2022-07-03 | Disposition: A | Discharge status: Home / Self Care | Payer: Medicare Other | Source: Ambulatory Visit | Attending: Urology | Admitting: Urology

## 2022-07-03 DIAGNOSIS — N200 Calculus of kidney: Secondary | ICD-10-CM | POA: Insufficient documentation

## 2022-07-05 ENCOUNTER — Telehealth: Payer: Self-pay | Admitting: Urology

## 2022-07-05 NOTE — Telephone Encounter (Signed)
Calculus was not visualized on KUB.  Is he aware of passing his stone?

## 2022-07-06 LAB — CULTURE, URINE COMPREHENSIVE

## 2022-07-06 NOTE — Telephone Encounter (Signed)
Patient states he does think he passed the stones.He is not having any more pain or problems.

## 2022-07-07 ENCOUNTER — Other Ambulatory Visit: Payer: Self-pay | Admitting: *Deleted

## 2022-07-07 DIAGNOSIS — N200 Calculus of kidney: Secondary | ICD-10-CM

## 2022-07-07 MED ORDER — SULFAMETHOXAZOLE-TRIMETHOPRIM 800-160 MG PO TABS: 1.0000 | ORAL_TABLET | Freq: Two times a day (BID) | ORAL | 0 refills | Status: DC

## 2022-07-07 NOTE — Telephone Encounter (Signed)
Please schedule renal ultrasound to make sure hydronephrosis has resolved.  Diagnosis: Right hydronephrosis secondary to obstructing stone

## 2022-07-07 NOTE — Progress Notes (Signed)
Please schedule renal ultrasound to make sure hydronephrosis has resolved. Diagnosis: Right hydronephrosis secondary to obstructing stone   Urine culture was positive.  Please send Rx Septra DS 1 twice daily x 7 days.   Notified patient as instructed, patient pleased. Discussed follow-up appointments, patient agrees for u/s

## 2022-07-12 ENCOUNTER — Ambulatory Visit: Payer: Medicare Other | Admitting: Urology

## 2022-07-12 ENCOUNTER — Ambulatory Visit
Admission: RE | Admit: 2022-07-12 | Discharge: 2022-07-12 | Disposition: A | Discharge status: Home / Self Care | Payer: Medicare Other | Source: Ambulatory Visit | Attending: Urology | Admitting: Urology

## 2022-07-12 DIAGNOSIS — N200 Calculus of kidney: Secondary | ICD-10-CM | POA: Insufficient documentation

## 2022-07-18 ENCOUNTER — Telehealth: Payer: Self-pay | Admitting: Family Medicine

## 2022-07-18 NOTE — Telephone Encounter (Signed)
Patient notified and voiced understanding. He wants to wait and if it starts causing pain he will schedule sooner, appointment made for 1 year.

## 2022-07-18 NOTE — Telephone Encounter (Signed)
-----   Message from Abbie Sons, MD sent at 07/14/2022  7:20 AM EST ----- Renal ultrasound showed resolution of hydronephrosis indicating stone was passed.  He does have a nonobstructing stone in the right kidney.  If he desires to continue surveillance follow-up appointment 1 year with KUB.  If he desires treatment of the stone can schedule a follow-up appointment to discuss options.

## 2022-08-21 ENCOUNTER — Ambulatory Visit
Admission: RE | Admit: 2022-08-21 | Discharge: 2022-08-21 | Disposition: A | Discharge status: Home / Self Care | Payer: Medicare Other | Source: Ambulatory Visit | Attending: Urology | Admitting: Urology

## 2022-08-21 ENCOUNTER — Ambulatory Visit (INDEPENDENT_AMBULATORY_CARE_PROVIDER_SITE_OTHER): Payer: Medicare Other | Admitting: Urology

## 2022-08-21 ENCOUNTER — Encounter: Payer: Self-pay | Admitting: Urology

## 2022-08-21 VITALS — BP 151/88 | HR 87 | Ht 71.0 in | Wt 205.0 lb

## 2022-08-21 DIAGNOSIS — M79661 Pain in right lower leg: Secondary | ICD-10-CM

## 2022-08-21 DIAGNOSIS — N433 Hydrocele, unspecified: Secondary | ICD-10-CM

## 2022-08-21 NOTE — Progress Notes (Signed)
Procedure note  Diagnosis: Left hydrocele  Indications: 82 y.o. male who underwent aspiration of a left hydrocele 08/03/2021 he noted several months at the procedure fluid reaccumulation which has continued to increase.  He has mild discomfort secondary to size and requested aspiration  Procedure: Aspiration hydrocele-left  Anesthesia: 1% plain Xylocaine-1 cc  Description: Scrotal ultrasound was performed and the testis was at the inferior portion of the scrotum.  The anterior scrotal skin was anesthetized with 1 mL of 1% plain Xylocaine.  A 20-gauge Angiocath was inserted through the anesthetized area.  The needle was removed and 520 mL of clear fluid was aspirated  Complication: None  Plan: Instructed to call for increasing scrotal pain, erythema or fever He indicated he desires to schedule hydrocelectomy should he have reaccumulation He also had blunt trauma to his right lower leg above the ankle this weekend.  There is significant bruising.  X-ray was ordered and will call with results   John Giovanni, MD

## 2022-08-24 ENCOUNTER — Telehealth: Payer: Self-pay | Admitting: *Deleted

## 2022-08-24 NOTE — Telephone Encounter (Signed)
-----   Message from Abbie Sons, MD sent at 08/24/2022  7:34 AM EDT ----- X-ray of legs showed no evidence of fracture.  There was soft tissue swelling.  Recommend he see PCP for increasing pain/swelling

## 2022-08-24 NOTE — Telephone Encounter (Signed)
Notified patient as instructed, patient pleased °

## 2023-07-12 ENCOUNTER — Other Ambulatory Visit: Payer: Self-pay | Admitting: *Deleted

## 2023-07-12 DIAGNOSIS — N200 Calculus of kidney: Secondary | ICD-10-CM

## 2023-07-13 ENCOUNTER — Telehealth: Payer: Self-pay | Admitting: Urology

## 2023-07-13 NOTE — Telephone Encounter (Signed)
So but no the scheduled is full

## 2023-07-13 NOTE — Telephone Encounter (Signed)
Pt has appt w/[facility]ioff next week and wants to know if he has time, he can do Aspiration hydrocele-left.

## 2023-07-19 ENCOUNTER — Ambulatory Visit: Payer: Medicare Other | Admitting: Urology

## 2023-07-19 ENCOUNTER — Ambulatory Visit
Admission: RE | Admit: 2023-07-19 | Discharge: 2023-07-19 | Disposition: A | Discharge status: Home / Self Care | Payer: Medicare Other | Source: Ambulatory Visit | Attending: Urology | Admitting: Urology

## 2023-07-19 ENCOUNTER — Encounter: Payer: Self-pay | Admitting: Urology

## 2023-07-19 VITALS — BP 132/72 | HR 82 | Ht 72.0 in | Wt 200.0 lb

## 2023-07-19 DIAGNOSIS — R351 Nocturia: Secondary | ICD-10-CM

## 2023-07-19 DIAGNOSIS — N433 Hydrocele, unspecified: Secondary | ICD-10-CM

## 2023-07-19 DIAGNOSIS — N401 Enlarged prostate with lower urinary tract symptoms: Secondary | ICD-10-CM | POA: Diagnosis not present

## 2023-07-19 DIAGNOSIS — N200 Calculus of kidney: Secondary | ICD-10-CM

## 2023-07-19 MED ORDER — TROSPIUM CHLORIDE 20 MG PO TABS: ORAL_TABLET | ORAL | 0 refills | Status: AC

## 2023-07-19 NOTE — Progress Notes (Signed)
 I, [name], acting as a scribe for Riki Altes, MD., have documented all relevant documentation on the behalf of Riki Altes, MD, as directed by Riki Altes, MD while in the presence of Riki Altes, MD.  07/19/2023 11:50 AM   [name] Dec 29, 1940 [id number]  Referring provider: Danella Penton, MD [phone] [facility] [address] [facility] [place],  [place] [address]  Chief Complaint  Patient presents with   Nephrolithiasis   Urologic history: 1.  Bilateral nephrolithiasis CT 2019 with bilateral nonobstructing renal calculi CT 06/22/2022 with a 3 x 6 mm right UVJ calculus and bilateral nephrolithiasis. He subsequently passed a calculus and a follow-up renal ultrasound, which showed resolution of his hydronephrosis.   2.  BPH with nocturia Tamsulosin daily  HPI: Tyler Walls is a 83 y.o. male presents for annual follow-up.  Doing well since last visit He does have nocturia 4-5 times per night  Remains on Tamsulosin He also has sleep apnea and uses CPAP regularly.  Denies dysuria, gross hematuria Denies flank, abdominal or pelvic pain   PMH: Past Medical History:  Diagnosis Date   Asthma    Chronic kidney disease    Coronary artery disease    GERD (gastroesophageal reflux disease)    Hypertension     Surgical History: Past Surgical History:  Procedure Laterality Date   CHOLECYSTECTOMY     COLONOSCOPY WITH PROPOFOL N/A 06/26/2018   Procedure: COLONOSCOPY WITH PROPOFOL;  Surgeon: Toledo, Boykin Nearing, MD;  Location: [facility] ENDOSCOPY;  Service: Gastroenterology;  Laterality: N/A;   EYE SURGERY     TRANSURETHRAL RESECTION OF PROSTATE      Home Medications:  Allergies as of 07/19/2023       Reactions   Ace Inhibitors         Medication List        Accurate as of July 19, 2023 11:50 AM. If you have any questions, ask your nurse or doctor.          albuterol 108 (90 Base) MCG/ACT inhaler Commonly known as: VENTOLIN  HFA USE 2 INHALATIONS BY MOUTH EVERY 6 HOURS AS NEEDED   allopurinol 100 MG tablet Commonly known as: ZYLOPRIM Take 200 mg by mouth daily.   amLODipine 5 MG tablet Commonly known as: NORVASC Take 5 mg by mouth daily.   aspirin EC 81 MG tablet Take 81 mg by mouth daily.   clonazePAM 1 MG tablet Commonly known as: KLONOPIN Take by mouth.   doxycycline 100 MG capsule Commonly known as: VIBRAMYCIN Take 100 mg by mouth 2 (two) times daily.   Eliquis 5 MG Tabs tablet Generic drug: apixaban Take 5 mg by mouth 2 (two) times daily.   finasteride 5 MG tablet Commonly known as: PROSCAR Take 5 mg by mouth daily.   fluticasone 50 MCG/ACT nasal spray Commonly known as: FLONASE Place 2 sprays into both nostrils daily.   HYDROcodone-acetaminophen 5-325 MG tablet Commonly known as: NORCO/VICODIN Take 0.5-1 tablets by mouth every 6 (six) hours as needed for moderate pain.   metoprolol tartrate 50 MG tablet Commonly known as: LOPRESSOR Take 50 mg by mouth 2 (two) times daily.   olmesartan 20 MG tablet Commonly known as: BENICAR Take 20 mg by mouth daily.   omeprazole 20 MG capsule Commonly known as: PRILOSEC Take 20 mg by mouth daily.   simvastatin 40 MG tablet Commonly known as: ZOCOR Take 40 mg by mouth daily at 6 PM.  tamsulosin 0.4 MG Caps capsule Commonly known as: FLOMAX Take 1 capsule (0.4 mg total) by mouth daily after breakfast.   trospium 20 MG tablet Commonly known as: SANCTURA Take 1 hour prior prior to bedtime        Allergies:  Allergies  Allergen Reactions   Ace Inhibitors     Family History: Family History  Problem Relation Age of Onset   Diabetes Mother     Social History:  reports that he has never smoked. He has never used smokeless tobacco. He reports current alcohol use. He reports that he does not use drugs.   Physical Exam: BP 132/72   Pulse 82   Ht 6' (1.829 m)   Wt 200 lb (90.7 kg)   BMI 27.12 kg/m   Constitutional:  Alert  and oriented, No acute distress. HEENT: [place] AT Respiratory: Normal respiratory effort, no increased work of breathing. Psychiatric: Normal mood and affect.  Assessment & Plan:    1. Bilateral nephrolithiasis Patient did not get a KUB prior to the visit this morning; will have it done after the visit. Will review and notify patient with the results.  2.  BPH with nocturia Stable on tamsulosin Trial of immediate release Trospium 20 mg 1hr prior to bedtime.   3. Left Hydrocele Patient has requested a hydrocele aspiration and will schedule   I have reviewed the above documentation for accuracy and completeness, and I agree with the above.   Riki Altes, MD  [facility] [address], [address] [place], [place] [address] [phone]

## 2023-08-16 ENCOUNTER — Ambulatory Visit (INDEPENDENT_AMBULATORY_CARE_PROVIDER_SITE_OTHER): Payer: Medicare Other | Admitting: Urology

## 2023-08-16 ENCOUNTER — Encounter: Payer: Self-pay | Admitting: Urology

## 2023-08-16 VITALS — BP 142/87 | HR 87 | Ht 71.0 in | Wt 200.0 lb

## 2023-08-16 DIAGNOSIS — N433 Hydrocele, unspecified: Secondary | ICD-10-CM | POA: Diagnosis not present

## 2023-08-16 NOTE — Progress Notes (Signed)
   Procedure note   Diagnosis: Left hydrocele   Indications: Symptomatic left hydrocele; last aspirated March 2023   Procedure: Hydrocele aspiration   Anesthesia: 1% plain Xylocaine   Description: Scrotal ultrasound was performed to determine testis location which was well posterior to the anechoic hydrocele.  The anterior scrotal skin was anesthetized with 1 mL of 1% plain Xylocaine.  A 16 gauge angiocatheter was inserted through the anesthetized area.  The needle was removed and 400 mL of clear fluid was aspirated.   Complications: None   Plan:  We discussed the possibility of hydrocele recurrence Consider hydrocelectomy if early recurrence and hydrocele as well as Call for fever, scrotal erythema       Irineo Axon, MD
